# Patient Record
Sex: Female | Born: 1955 | Race: White | Hispanic: No | Marital: Married | Smoking: Former smoker
Health system: Southern US, Community
[De-identification: ages and names within clinical notes are randomized; demographics above are authoritative.]

## PROBLEM LIST (undated history)

## (undated) DIAGNOSIS — L439 Lichen planus, unspecified: Secondary | ICD-10-CM

## (undated) DIAGNOSIS — S0191XA Laceration without foreign body of unspecified part of head, initial encounter: Secondary | ICD-10-CM

## (undated) DIAGNOSIS — N6019 Diffuse cystic mastopathy of unspecified breast: Secondary | ICD-10-CM

## (undated) DIAGNOSIS — E039 Hypothyroidism, unspecified: Secondary | ICD-10-CM

## (undated) DIAGNOSIS — T4145XA Adverse effect of unspecified anesthetic, initial encounter: Secondary | ICD-10-CM

## (undated) DIAGNOSIS — E0500 Thyrotoxicosis with diffuse goiter without thyrotoxic crisis or storm: Secondary | ICD-10-CM

## (undated) DIAGNOSIS — T8859XA Other complications of anesthesia, initial encounter: Secondary | ICD-10-CM

## (undated) HISTORY — DX: Thyrotoxicosis with diffuse goiter without thyrotoxic crisis or storm: E05.00

## (undated) HISTORY — DX: Lichen planus, unspecified: L43.9

---

## 2000-09-26 HISTORY — PX: BREAST BIOPSY: SHX20

## 2001-05-16 ENCOUNTER — Other Ambulatory Visit: Admission: RE | Admit: 2001-05-16 | Discharge: 2001-05-16 | Payer: Self-pay | Admitting: Radiology

## 2002-09-26 HISTORY — PX: ABDOMINAL HYSTERECTOMY: SHX81

## 2007-02-06 ENCOUNTER — Ambulatory Visit: Payer: Self-pay | Admitting: Internal Medicine

## 2007-07-19 ENCOUNTER — Encounter: Payer: Self-pay | Admitting: Internal Medicine

## 2007-07-28 ENCOUNTER — Encounter: Payer: Self-pay | Admitting: Internal Medicine

## 2008-06-17 ENCOUNTER — Ambulatory Visit: Payer: Self-pay | Admitting: Internal Medicine

## 2010-03-24 ENCOUNTER — Ambulatory Visit: Payer: Self-pay | Admitting: Internal Medicine

## 2010-06-07 ENCOUNTER — Ambulatory Visit: Payer: Self-pay | Admitting: Internal Medicine

## 2012-09-17 ENCOUNTER — Other Ambulatory Visit (HOSPITAL_COMMUNITY)
Admission: RE | Admit: 2012-09-17 | Discharge: 2012-09-17 | Disposition: A | Discharge status: Home / Self Care | Payer: 59 | Source: Ambulatory Visit | Attending: Internal Medicine | Admitting: Internal Medicine

## 2012-09-17 ENCOUNTER — Encounter: Payer: Self-pay | Admitting: Internal Medicine

## 2012-09-17 ENCOUNTER — Ambulatory Visit (INDEPENDENT_AMBULATORY_CARE_PROVIDER_SITE_OTHER): Payer: 59 | Admitting: Internal Medicine

## 2012-09-17 VITALS — BP 126/70 | HR 68 | Temp 98.2°F | Ht 69.0 in | Wt 166.8 lb

## 2012-09-17 DIAGNOSIS — R5383 Other fatigue: Secondary | ICD-10-CM

## 2012-09-17 DIAGNOSIS — L439 Lichen planus, unspecified: Secondary | ICD-10-CM

## 2012-09-17 DIAGNOSIS — E0500 Thyrotoxicosis with diffuse goiter without thyrotoxic crisis or storm: Secondary | ICD-10-CM

## 2012-09-17 DIAGNOSIS — Z01419 Encounter for gynecological examination (general) (routine) without abnormal findings: Secondary | ICD-10-CM | POA: Insufficient documentation

## 2012-09-17 DIAGNOSIS — Z0000 Encounter for general adult medical examination without abnormal findings: Secondary | ICD-10-CM

## 2012-09-17 DIAGNOSIS — N951 Menopausal and female climacteric states: Secondary | ICD-10-CM

## 2012-09-17 DIAGNOSIS — R413 Other amnesia: Secondary | ICD-10-CM

## 2012-09-17 DIAGNOSIS — Z139 Encounter for screening, unspecified: Secondary | ICD-10-CM

## 2012-09-17 DIAGNOSIS — R5381 Other malaise: Secondary | ICD-10-CM

## 2012-09-17 DIAGNOSIS — Z1151 Encounter for screening for human papillomavirus (HPV): Secondary | ICD-10-CM | POA: Insufficient documentation

## 2012-09-18 ENCOUNTER — Other Ambulatory Visit (INDEPENDENT_AMBULATORY_CARE_PROVIDER_SITE_OTHER): Payer: 59

## 2012-09-18 DIAGNOSIS — N951 Menopausal and female climacteric states: Secondary | ICD-10-CM

## 2012-09-18 DIAGNOSIS — R413 Other amnesia: Secondary | ICD-10-CM

## 2012-09-18 DIAGNOSIS — R5381 Other malaise: Secondary | ICD-10-CM

## 2012-09-18 DIAGNOSIS — E0500 Thyrotoxicosis with diffuse goiter without thyrotoxic crisis or storm: Secondary | ICD-10-CM

## 2012-09-18 DIAGNOSIS — R5383 Other fatigue: Secondary | ICD-10-CM

## 2012-09-18 DIAGNOSIS — Z139 Encounter for screening, unspecified: Secondary | ICD-10-CM

## 2012-09-18 LAB — CBC WITH DIFFERENTIAL/PLATELET
Eosinophils Relative: 2.9 % (ref 0.0–5.0)
HCT: 39.2 % (ref 36.0–46.0)
Lymphs Abs: 1.7 10*3/uL (ref 0.7–4.0)
MCHC: 33.2 g/dL (ref 30.0–36.0)
MCV: 89 fl (ref 78.0–100.0)
Monocytes Absolute: 0.4 10*3/uL (ref 0.1–1.0)
Platelets: 214 10*3/uL (ref 150.0–400.0)
RDW: 13.1 % (ref 11.5–14.6)
WBC: 4.9 10*3/uL (ref 4.5–10.5)

## 2012-09-18 LAB — COMPREHENSIVE METABOLIC PANEL
Alkaline Phosphatase: 46 U/L (ref 39–117)
BUN: 12 mg/dL (ref 6–23)
Glucose, Bld: 108 mg/dL — ABNORMAL HIGH (ref 70–99)
Sodium: 136 mEq/L (ref 135–145)
Total Bilirubin: 0.9 mg/dL (ref 0.3–1.2)
Total Protein: 7.2 g/dL (ref 6.0–8.3)

## 2012-09-18 LAB — LIPID PANEL
Cholesterol: 151 mg/dL (ref 0–200)
LDL Cholesterol: 52 mg/dL (ref 0–99)
Triglycerides: 33 mg/dL (ref 0.0–149.0)
VLDL: 6.6 mg/dL (ref 0.0–40.0)

## 2012-09-20 ENCOUNTER — Other Ambulatory Visit: Payer: Self-pay | Admitting: Internal Medicine

## 2012-09-20 DIAGNOSIS — R739 Hyperglycemia, unspecified: Secondary | ICD-10-CM

## 2012-09-20 NOTE — Progress Notes (Signed)
Orders placed for follow up labs 

## 2012-09-22 ENCOUNTER — Encounter: Payer: Self-pay | Admitting: Internal Medicine

## 2012-09-22 NOTE — Assessment & Plan Note (Signed)
On no medication.  Check thyroid function.

## 2012-09-22 NOTE — Progress Notes (Signed)
Subjective:    [name], female    DOB: [birth year], 56 y.o.   MRN: [id number]  HPI 56 year old female with past history of Graves disease and Lichen Planus who comes in today to follow up on these issues as well as for a complete physical exam.  She is noticing some increased acid reflux previously.  Not noticing now.  Discussed the importance of not eating late and avoiding foods that aggravate.  She is also wanting to be checked if she is post menopausal.  Wants FSH checked.  Some minimal hot flashes.  Some concern with not being able to remember as well as she could previously.   No affecting her work.  Has been under a lot of stress lately.  Feels since some of this is leveling off, the memory is improving.  Some pain in her left groin.  Present for 3-4 weeks.  (actually pain localized to left inner thigh).  Denies any known trauma or injury.  She feels better.  Stress is better.  New job.  Some fatigue.    Past Medical History  Diagnosis Date  . Graves disease   . Lichen planus     Review of Systems Patient denies any headache, lightheadedness or dizziness.  No significant sinus or allergy symptoms.  No chest pain, tightness or palpitations.  No increased shortness of breath, cough or congestion.  No nausea or vomiting. Acid reflux resolved now.  No abdominal pain or cramping.  No bowel change, such as diarrhea, constipation, BRBPR or melana.  No urine change.  Some minimal hot flashes as outlined.        Objective:   Physical Exam Filed Vitals:   09/17/12 1052  BP: 126/70  Pulse: 68  Temp: 98.2 F (77.74 C)   56 year old female in no acute distress.   HEENT:  Nares- clear.  Oropharynx - without lesions. NECK:  Supple.  Nontender.  No audible bruit.  HEART:  Appears to be regular. LUNGS:  No crackles or wheezing audible.  Respirations even and unlabored.  RADIAL PULSE:  Equal bilaterally.    BREASTS:  No nipple discharge or nipple retraction present.  Could not  appreciate any distinct nodules or axillary adenopathy.  ABDOMEN:  Soft, nontender.  Bowel sounds present and normal.  No audible abdominal bruit.  GU:  Normal external genitalia.  Vaginal vault without lesions.  S/P hysterectomy.  Pap performed. Could not appreciate any adnexal masses or tenderness.   RECTAL:  Heme negative.   EXTREMITIES:  No increased edema present.  DP pulses palpable and equal bilaterally.           Assessment & Plan:  MEMORY CHANGE.  Discussed at length with her today.  She did well on the mini mental status exam (not missing any questions).  She feels memory is improving.  Discussed possible etiologies, including poor concentration, etc.  Will follow.  Can check routine labs including B12.    MSK.  Left inner thigh discomfort.  No pain to palpation on exam.  No mass.  Discussed stretches, etc.  Follow.  Notify me if persistent problems.    FATIGUE.  Check cbc, met c and tsh.   PROBABLE MENOPAUSAL SYNDROME.  Pt request FSH check.    CARDIOVASCULAR.  Asymptomatic.  ECHO 02/21/07 revealed normal LV systolic function with EF 50%.  Moderate mitral and tricuspid regurgitation with mild aortic insufficiency.   Follow.    PREVIOUS ACID REFLUX.  Not an issue  now.  Discussed not eating late, etc. Follow.    HEALTH MAINTENANCE.  Physical today.  Schedule mammogram.  Needs screening colonoscopy.  Refer to GI.

## 2012-09-22 NOTE — Assessment & Plan Note (Signed)
Stable.  Follow.   

## 2012-09-27 ENCOUNTER — Ambulatory Visit: Payer: Self-pay | Admitting: Internal Medicine

## 2012-10-08 ENCOUNTER — Other Ambulatory Visit (INDEPENDENT_AMBULATORY_CARE_PROVIDER_SITE_OTHER): Payer: 59

## 2012-10-08 DIAGNOSIS — R739 Hyperglycemia, unspecified: Secondary | ICD-10-CM

## 2012-10-08 DIAGNOSIS — R7309 Other abnormal glucose: Secondary | ICD-10-CM

## 2012-10-10 ENCOUNTER — Encounter: Payer: Self-pay | Admitting: *Deleted

## 2012-10-10 ENCOUNTER — Encounter: Payer: Self-pay | Admitting: Internal Medicine

## 2012-10-15 ENCOUNTER — Telehealth: Payer: Self-pay | Admitting: Internal Medicine

## 2012-10-15 NOTE — Telephone Encounter (Signed)
Voice mail taking off triage line, from Annice Pih that stated she faxed recommendations on Thursday and hasn't heard back from office. Please advise, she didn't leave a number for [place] to call her back at.

## 2012-10-17 NOTE — Telephone Encounter (Signed)
Faxed form.

## 2012-10-17 NOTE — Telephone Encounter (Signed)
Order signed and placed on your desk. Please fax.  °

## 2012-11-10 ENCOUNTER — Other Ambulatory Visit: Payer: Self-pay

## 2013-02-14 ENCOUNTER — Telehealth: Payer: Self-pay | Admitting: *Deleted

## 2013-02-14 ENCOUNTER — Other Ambulatory Visit: Payer: Self-pay | Admitting: *Deleted

## 2013-02-14 MED ORDER — TRIAMCINOLONE 0.1 % CREAM:EUCERIN CREAM 1:1: 1.0000 "application " | TOPICAL_CREAM | Freq: Two times a day (BID) | CUTANEOUS | Status: DC

## 2013-02-14 NOTE — Telephone Encounter (Signed)
Ok to refill x 1  

## 2013-02-14 NOTE — Telephone Encounter (Signed)
Okay to refill? 

## 2013-02-14 NOTE — Telephone Encounter (Signed)
Refilled x1 

## 2013-02-14 NOTE — Telephone Encounter (Signed)
Refill Request  Triamcinolone 0.1% topical cream GM  #80  Apply to rash twice a day     PHARMACY NOTE:  Patient as poison ivy again

## 2013-03-08 ENCOUNTER — Other Ambulatory Visit: Payer: Self-pay | Admitting: *Deleted

## 2013-03-08 ENCOUNTER — Telehealth: Payer: Self-pay | Admitting: *Deleted

## 2013-03-08 NOTE — Telephone Encounter (Signed)
done

## 2013-03-08 NOTE — Telephone Encounter (Signed)
Okay to refill? 

## 2013-03-08 NOTE — Telephone Encounter (Signed)
Progesterone 100mg  #45 with 2 RF called in (Could not enter RX in meds & orders)

## 2013-03-08 NOTE — Telephone Encounter (Signed)
Opened in eror

## 2013-03-08 NOTE — Telephone Encounter (Signed)
Refill Request  Progesterone 100 mg SR cap  Take one to two capsules by mouth at bedtime as directed

## 2013-03-08 NOTE — Telephone Encounter (Signed)
Ok to refill x 2.  Will probably need to be called in.  I could not get this to come up in system.

## 2013-03-18 ENCOUNTER — Encounter: Payer: Self-pay | Admitting: Internal Medicine

## 2013-03-18 ENCOUNTER — Ambulatory Visit (INDEPENDENT_AMBULATORY_CARE_PROVIDER_SITE_OTHER): Payer: 59 | Admitting: Internal Medicine

## 2013-03-18 VITALS — BP 120/70 | HR 54 | Temp 98.3°F | Ht 69.0 in | Wt 165.2 lb

## 2013-03-18 DIAGNOSIS — L439 Lichen planus, unspecified: Secondary | ICD-10-CM

## 2013-03-18 DIAGNOSIS — E0500 Thyrotoxicosis with diffuse goiter without thyrotoxic crisis or storm: Secondary | ICD-10-CM

## 2013-03-18 NOTE — Progress Notes (Signed)
  Subjective:    [name], female    DOB: 18-Nov-1955, 57 y.o.   MRN: [id number]  HPI 57 year old female with past history of Graves disease and Lichen Planus who comes in today for a scheduled follow up. Was under a lot of stress previously.  Has changed jobs.  Stress better now.  She feels better.  The previous groin pain has resolved.  No acid reflux.  Stays active.  No chest pain or tightness.  No sob.  She does report some right hip discomfort at times.  Does not limit her in doing her normal activity.  Plays racketball 2x/week.  Planning to start Yoga.  Overall she feels she is doing better.      Past Medical History  Diagnosis Date  . Graves disease   . Lichen planus     Outpatient Encounter Prescriptions as of 03/18/2013  Medication Sig Dispense Refill  . progesterone (PROMETRIUM) 100 MG capsule Take 100 mg by mouth daily. Take 1-2 at bedtime      . Triamcinolone Acetonide (TRIAMCINOLONE 0.1 % CREAM : EUCERIN) CREA Apply 1 application topically 2 (two) times daily. Apply to poison ivy  1 each  0   No facility-administered encounter medications on file as of 03/18/2013.    Review of Systems Patient denies any headache, lightheadedness or dizziness.  No significant sinus or allergy symptoms.  No chest pain, tightness or palpitations.  No increased shortness of breath, cough or congestion.  No nausea or vomiting.  No acid reflux.  No abdominal pain or cramping.  No bowel change, such as diarrhea, constipation, BRBPR or melana.  No urine change.  Overall she feels better and feels she id doing well.      Objective:   Physical Exam  Filed Vitals:   03/18/13 1121  BP: 120/70  Pulse: 54  Temp: 98.3 F (36.8 C)   Pulse recheck:  18-77  57 year old female in no acute distress.   HEENT:  Nares- clear.  Oropharynx - without lesions. NECK:  Supple.  Nontender.  No audible bruit.  HEART:  Appears to be regular. LUNGS:  No crackles or wheezing audible.  Respirations  even and unlabored.  RADIAL PULSE:  Equal bilaterally. ABDOMEN:  Soft, nontender.  Bowel sounds present and normal.  No audible abdominal bruit.    EXTREMITIES:  No increased edema present.  DP pulses palpable and equal bilaterally.           Assessment & Plan:  MEMORY CHANGE.  Not an issue for her now.  Follow.     MSK.  The previus left inner thigh pain resolved.  Some intermittent right hip pain.  Desires no further w/up now.  Plans to start Yoga.  Follow.    CARDIOVASCULAR.  Asymptomatic.  ECHO 02/21/07 revealed normal LV systolic function with EF 50%.  Moderate mitral and tricuspid regurgitation with mild aortic insufficiency.   Follow.    PREVIOUS ACID REFLUX.  Not an issue now.  Discussed not eating late, etc. Follow.    HEALTH MAINTENANCE.  Physical 09/17/12.  Mammogram 09/27/12 - Birads II.  Was referred to GI last visit.  Obtain records.

## 2013-03-18 NOTE — Assessment & Plan Note (Signed)
Stable.  Follow.   

## 2013-03-18 NOTE — Assessment & Plan Note (Signed)
On no medication.  Follow thyroid function.  Last check wnl.

## 2013-07-01 ENCOUNTER — Ambulatory Visit (INDEPENDENT_AMBULATORY_CARE_PROVIDER_SITE_OTHER): Payer: 59 | Admitting: Internal Medicine

## 2013-07-01 ENCOUNTER — Encounter: Payer: Self-pay | Admitting: Internal Medicine

## 2013-07-01 VITALS — BP 122/78 | HR 58 | Temp 98.3°F | Ht 69.0 in | Wt 162.8 lb

## 2013-07-01 DIAGNOSIS — Z9109 Other allergy status, other than to drugs and biological substances: Secondary | ICD-10-CM

## 2013-07-01 DIAGNOSIS — L439 Lichen planus, unspecified: Secondary | ICD-10-CM

## 2013-07-01 DIAGNOSIS — K1370 Unspecified lesions of oral mucosa: Secondary | ICD-10-CM

## 2013-07-01 LAB — CBC WITH DIFFERENTIAL/PLATELET
Basophils Relative: 0.6 % (ref 0.0–3.0)
Eosinophils Absolute: 0.1 10*3/uL (ref 0.0–0.7)
Lymphocytes Relative: 30.4 % (ref 12.0–46.0)
MCHC: 34 g/dL (ref 30.0–36.0)
Monocytes Relative: 6.2 % (ref 3.0–12.0)
Neutrophils Relative %: 61.7 % (ref 43.0–77.0)
RBC: 4.18 Mil/uL (ref 3.87–5.11)
WBC: 6.1 10*3/uL (ref 4.5–10.5)

## 2013-07-01 NOTE — Patient Instructions (Signed)
Saline nasal spray - flush nose at least 2-3 times per day.  claritin or allegra as directed.

## 2013-07-03 ENCOUNTER — Encounter: Payer: Self-pay | Admitting: Internal Medicine

## 2013-07-03 DIAGNOSIS — Z9109 Other allergy status, other than to drugs and biological substances: Secondary | ICD-10-CM | POA: Insufficient documentation

## 2013-07-03 NOTE — Assessment & Plan Note (Signed)
Saline flushes as directed.  Antihistamine if needed.  Follow.

## 2013-07-03 NOTE — Progress Notes (Signed)
  Subjective:    [name], female    DOB: November 27, 1955, 57 y.o.   MRN: [id number]  Mouth Lesions  Associated symptoms include mouth sores.  57 year old female with past history of Graves disease and Lichen Planus who comes in today as a work in with concerns regarding some mouth lesions.  States she first noticed (last week) a lesion on her right lower lip.  This week, noticed a lesion on her left lower lip.  She also noticed some places inside her mouth (right and left).  The lesion on her lip is raised.  The inside lesions looks like appear to possibly be more c/w trauma.  She has had some minimal congestion and sore throat this am.  Ears feel stopped up at times.  Glands feel a little swollen.  Some drainage.  No fever.  No rash.  No significant headache.  No nausea or vomiting.  Eating and drinking well.  Previous bite on her ankle.  No joint stiffness.      Past Medical History  Diagnosis Date  . Graves disease   . Lichen planus     Outpatient Encounter Prescriptions as of 07/01/2013  Medication Sig Dispense Refill  . progesterone (PROMETRIUM) 100 MG capsule Take 100 mg by mouth daily. Take 1-2 at bedtime      . Triamcinolone Acetonide (TRIAMCINOLONE 0.1 % CREAM : EUCERIN) CREA Apply 1 application topically 2 (two) times daily. Apply to poison ivy  1 each  0   No facility-administered encounter medications on file as of 07/01/2013.    Review of Systems  HENT: Positive for mouth sores.   Patient denies any headache, lightheadedness or dizziness.  Minimal congestion and drainage as outlined.  No chest pain, tightness or palpitations.  No increased shortness of breath, cough or congestion.  No nausea or vomiting.  No acid reflux.  No abdominal pain or cramping.  No bowel change, such as diarrhea, constipation, BRBPR or melana.  No urine change.  Overall she feels better and feels good.       Objective:   Physical Exam  Filed Vitals:   07/01/13 1036  BP: 122/78  Pulse:  58  Temp: 98.3 F (23.31 C)   57 year old female in no acute distress.   HEENT:  Nares- clear.  TMs without erythema.  Small raised lip lesion.  Few point lesions oropharynx - appear to be more c/w trauma.  No lesions or erythema - posterior oropharynx.   NECK:  Supple.  Nontender.  No audible bruit.  No adenopathy appreciated.   HEART:  Appears to be regular. LUNGS:  No crackles or wheezing audible.  Respirations even and unlabored.    EXTREMITIES:  No increased edema present.  DP pulses palpable and equal bilaterally.      SKIN:  No rash.       Assessment & Plan:  MOUTH LESIONS.  Lip lesion as outlined.  May be a viral etiology.  denavir cream as directed.  Follow.  The lesions in the oropharynx appear to be more c/w trauma.  Discussed multiple etiologies.  Non tender.  Will check cbc.  Monitor.  Follow.  If persistent, will have ENT evaluate.  She will call with an update over the next week.     HEALTH MAINTENANCE.  Physical 09/17/12.  Mammogram 09/27/12 - Birads II.  Was referred to GI last visit.  Obtain records.

## 2013-07-03 NOTE — Assessment & Plan Note (Signed)
Does not appear to be c/w lichen planus.  Stable. Follow.

## 2013-07-04 NOTE — Telephone Encounter (Signed)
Mailed unread message to pt  

## 2013-07-09 ENCOUNTER — Encounter: Payer: Self-pay | Admitting: Internal Medicine

## 2013-09-24 ENCOUNTER — Encounter: Payer: Self-pay | Admitting: Internal Medicine

## 2013-09-24 ENCOUNTER — Ambulatory Visit (INDEPENDENT_AMBULATORY_CARE_PROVIDER_SITE_OTHER): Payer: No Typology Code available for payment source | Admitting: Internal Medicine

## 2013-09-24 VITALS — BP 120/70 | HR 65 | Temp 98.2°F | Ht 69.5 in | Wt 166.8 lb

## 2013-09-24 DIAGNOSIS — Z1239 Encounter for other screening for malignant neoplasm of breast: Secondary | ICD-10-CM

## 2013-09-24 DIAGNOSIS — E039 Hypothyroidism, unspecified: Secondary | ICD-10-CM

## 2013-09-24 DIAGNOSIS — E0500 Thyrotoxicosis with diffuse goiter without thyrotoxic crisis or storm: Secondary | ICD-10-CM

## 2013-09-24 DIAGNOSIS — L439 Lichen planus, unspecified: Secondary | ICD-10-CM

## 2013-09-24 NOTE — Progress Notes (Signed)
Pre-visit discussion using our clinic review tool. No additional management support is needed unless otherwise documented below in the visit note.  

## 2013-09-24 NOTE — Progress Notes (Signed)
  Subjective:    [name], female    DOB: Jan 30, 1956, 57 y.o.   MRN: [id number]  HPI 57 year old female with past history of Graves disease and Lichen Planus who comes in today to follow up on these issues as well as for a complete physical exam.   Was under a lot of stress previously.  Has changed jobs.  Stress better now.  She feels better.  No acid reflux.  Stays active.  No chest pain or tightness.  No sob.   Plays racketball 2x/week.  Planning to start Yoga.  Overall she feels she is doing better.  Taking the progesterone.  Some decreased libido.  Wants to try the testosterone again.  Tolerated previously.  The previous lesion on her lip resolved.  She saw her dentist and was told the lesions on her mouth were a form of lichen planus.  Some congestion, but overall feels she is doing well.      Past Medical History  Diagnosis Date  . Graves disease   . Lichen planus     Outpatient Encounter Prescriptions as of 09/24/2013  Medication Sig  . Misc Natural Products (NF FORMULAS TESTOSTERONE PO) Take by mouth. Take 1mg  twice a week  . progesterone (PROMETRIUM) 100 MG capsule Take 100 mg by mouth daily. Take 1-2 at bedtime  . Triamcinolone Acetonide (TRIAMCINOLONE 0.1 % CREAM : EUCERIN) CREA Apply 1 application topically 2 (two) times daily. Apply to poison ivy    Review of Systems Patient denies any headache, lightheadedness or dizziness.  No significant sinus or allergy symptoms.  No chest pain, tightness or palpitations.  No increased shortness of breath.  Some cough and congestion.   No nausea or vomiting.  No acid reflux.  No abdominal pain or cramping.  No bowel change, such as diarrhea, constipation, BRBPR or melana.  No urine change.  Overall she feels better and feels she is doing well.      Objective:   Physical Exam  Filed Vitals:   09/24/13 1049  BP: 120/70  Pulse: 65  Temp: 98.2 F (28.1 C)   57 year old female in no acute distress.   HEENT:  Nares-  clear.  Oropharynx - without lesions. NECK:  Supple.  Nontender.  No audible bruit.  HEART:  Appears to be regular. LUNGS:  No crackles or wheezing audible.  Respirations even and unlabored.  RADIAL PULSE:  Equal bilaterally.    BREASTS:  No nipple discharge or nipple retraction present.  Could not appreciate any distinct nodules or axillary adenopathy.  ABDOMEN:  Soft, nontender.  Bowel sounds present and normal.  No audible abdominal bruit.  GU:  Not performed.     EXTREMITIES:  No increased edema present.  DP pulses palpable and equal bilaterally.          Assessment & Plan:  MEMORY CHANGE.  Not a significant issue for her now.  She feels is better.  Follow.     CARDIOVASCULAR.  Asymptomatic.  ECHO 02/21/07 revealed normal LV systolic function with EF 50%.  Moderate mitral and tricuspid regurgitation with mild aortic insufficiency.   Follow.    PREVIOUS ACID REFLUX.  Not an issue now.  Discussed not eating late, etc. Follow.    HEALTH MAINTENANCE.  Physical today.  Mammogram 09/27/12 - Birads II.  Schedule a f/u mammogram.   Needs colonoscopy.

## 2013-09-26 ENCOUNTER — Encounter: Payer: Self-pay | Admitting: Internal Medicine

## 2013-09-26 NOTE — Assessment & Plan Note (Signed)
Stable

## 2013-09-26 NOTE — Assessment & Plan Note (Addendum)
On no medication.  Follow thyroid function.  Last check wnl.  Recheck today.

## 2013-10-07 ENCOUNTER — Ambulatory Visit: Payer: Self-pay | Admitting: Internal Medicine

## 2013-10-07 LAB — HM MAMMOGRAPHY: HM Mammogram: NEGATIVE

## 2013-10-08 ENCOUNTER — Encounter: Payer: Self-pay | Admitting: Internal Medicine

## 2013-10-09 ENCOUNTER — Other Ambulatory Visit: Payer: Self-pay | Admitting: Internal Medicine

## 2013-10-10 ENCOUNTER — Other Ambulatory Visit: Payer: Self-pay | Admitting: *Deleted

## 2013-10-10 MED ORDER — PROGESTERONE MICRONIZED 100 MG PO CAPS: 100.0000 mg | ORAL_CAPSULE | Freq: Every day | ORAL | Status: DC

## 2013-10-10 MED ORDER — NF FORMULAS TESTOSTERONE PO CAPS: ORAL_CAPSULE | ORAL | Status: DC

## 2014-06-19 ENCOUNTER — Other Ambulatory Visit: Payer: Self-pay | Admitting: *Deleted

## 2014-06-19 MED ORDER — PROGESTERONE MICRONIZED 100 MG PO CAPS: ORAL_CAPSULE | ORAL | Status: DC

## 2014-06-19 NOTE — Telephone Encounter (Signed)
Refilled prometrium #45 with no refills.

## 2014-06-19 NOTE — Telephone Encounter (Signed)
Ok refill? 

## 2014-07-25 ENCOUNTER — Telehealth: Payer: Self-pay | Admitting: *Deleted

## 2014-07-25 ENCOUNTER — Telehealth: Payer: Self-pay | Admitting: Internal Medicine

## 2014-07-25 NOTE — Telephone Encounter (Signed)
Pt returned called regarding Rx request for the E3 1MG  + TESTOST 1MG  VAG TAB. (directions: insert one tablet vaginally once a day for 7-10 days, then 2-3 times a week as directed). Pt states that she would like to restart this medication after noticing some dryness. She just used up the previous Rx that she didn't even start until now. Pt reports that it seems to be helping. Please advise on refill.

## 2014-07-25 NOTE — Telephone Encounter (Signed)
Notify her that I am ok to refill with the instructions to use 2x/week.  It appears from the note that the pharmacy sent a request.  If we can call or send in that request, I wasn;t sure which specific testosterone this was.  Give her #30 with no refills.  Let her know we can talk about this at her next appt.  Would like to get her off of this eventually.

## 2014-07-25 NOTE — Telephone Encounter (Signed)
LMTCB-needs to know if she still uses the Testost? If so how often does she uses it? A refill request was received from [facility]. Sent mychart message to patient also.

## 2014-07-25 NOTE — Telephone Encounter (Signed)
Pt notified Rx faxed.

## 2014-07-27 NOTE — Telephone Encounter (Signed)
Opened in error

## 2014-09-29 ENCOUNTER — Telehealth: Payer: Self-pay | Admitting: *Deleted

## 2014-09-29 ENCOUNTER — Ambulatory Visit (INDEPENDENT_AMBULATORY_CARE_PROVIDER_SITE_OTHER): Payer: No Typology Code available for payment source | Admitting: Internal Medicine

## 2014-09-29 ENCOUNTER — Encounter: Payer: Self-pay | Admitting: Internal Medicine

## 2014-09-29 VITALS — BP 120/64 | HR 66 | Temp 98.2°F | Ht 68.75 in | Wt 165.5 lb

## 2014-09-29 DIAGNOSIS — E0500 Thyrotoxicosis with diffuse goiter without thyrotoxic crisis or storm: Secondary | ICD-10-CM

## 2014-09-29 DIAGNOSIS — Z13220 Encounter for screening for lipoid disorders: Secondary | ICD-10-CM

## 2014-09-29 DIAGNOSIS — Z0000 Encounter for general adult medical examination without abnormal findings: Secondary | ICD-10-CM

## 2014-09-29 DIAGNOSIS — L439 Lichen planus, unspecified: Secondary | ICD-10-CM

## 2014-09-29 DIAGNOSIS — R739 Hyperglycemia, unspecified: Secondary | ICD-10-CM

## 2014-09-29 DIAGNOSIS — Z1239 Encounter for other screening for malignant neoplasm of breast: Secondary | ICD-10-CM

## 2014-09-29 MED ORDER — NF FORMULAS TESTOSTERONE PO CAPS: ORAL_CAPSULE | ORAL | Status: DC

## 2014-09-29 MED ORDER — PROGESTERONE MICRONIZED 100 MG PO CAPS
ORAL_CAPSULE | ORAL | Status: DC
Start: 2014-09-29 — End: 2014-09-30

## 2014-09-29 NOTE — Progress Notes (Signed)
Subjective:    [name], female    DOB: 07/06/1956, 59 y.o.   MRN: [id number]  HPI 59 year old female with past history of Graves disease and Lichen Planus who comes in today to follow up on these issues as well as for a complete physical exam.  Stress better.  Enjoying her job.  She feels better.  No acid reflux.  Stays active.  No chest pain or tightness.  No sob.   Plays racketball.    Overall she feels she is doing better.   Wants to try the testosterone again.  Tolerated previously.  Also wants to return to her low dose estrogen.  She uses sparingly.  Lichen planus - stable.  Overall feels she is doing well.       Past Medical History  Diagnosis Date  . Graves disease   . Lichen planus     Outpatient Encounter Prescriptions as of 09/29/2014  Medication Sig  . Misc Natural Products (NF FORMULAS TESTOSTERONE) CAPS Use as directed  . progesterone (PROMETRIUM) 100 MG capsule Take 1-2 at bedtime  . Triamcinolone Acetonide (TRIAMCINOLONE 0.1 % CREAM : EUCERIN) CREA Apply 1 application topically 2 (two) times daily. Apply to poison ivy    Review of Systems Patient denies any headache, lightheadedness or dizziness.  No significant sinus or allergy symptoms.  No chest pain, tightness or palpitations.  No increased shortness of breath.  No cough and congestion.   No nausea or vomiting.  No acid reflux.  No abdominal pain or cramping.  No bowel change, such as diarrhea, constipation, BRBPR or melana.  No urine change.  Overall she feels better and feels she is doing well.  Enjoying her job.  Stress better.  Desires to start back on her testosterone and estrogen.       Objective:   Physical Exam  Filed Vitals:   09/29/14 1338  BP: 120/64  Pulse: 66  Temp: 98.2 F (36.8 C)   Blood pressure recheck:  27/79  59 year old female in no acute distress.   HEENT:  Nares- clear.  Oropharynx - without lesions. NECK:  Supple.  Nontender.  No audible bruit.  HEART:  Appears to  be regular. LUNGS:  No crackles or wheezing audible.  Respirations even and unlabored.  RADIAL PULSE:  Equal bilaterally.    BREASTS:  No nipple discharge or nipple retraction present.  Could not appreciate any distinct nodules or axillary adenopathy.  ABDOMEN:  Soft, nontender.  Bowel sounds present and normal.  No audible abdominal bruit.  GU:  Not performed.     EXTREMITIES:  No increased edema present.  DP pulses palpable and equal bilaterally.          Assessment & Plan:  1. Breast cancer screening - MM DIGITAL SCREENING BILATERAL; Future  2. Graves disease Stable.   - TSH; Future  3. Lichen planus Stable.  Followed by her dentist.   - CBC with Differential; Future  4. Hyperglycemia Last a1c wnl.  Recheck to confirm stable.   - Comprehensive metabolic panel; Future - Hemoglobin A1c; Future  5. Screening cholesterol level - Lipid panel; Future  6. CARDIOVASCULAR.  Asymptomatic.  ECHO 02/21/07 revealed normal LV systolic function with EF 50%.  Moderate mitral and tricuspid regurgitation with mild aortic insufficiency.  Stays active with no cardiac symptoms with increased activity or exertion.      7. PREVIOUS ACID REFLUX.  Not an issue now.    HEALTH MAINTENANCE.  Physical today.  Mammogram 09/27/13 - Birads I.  Schedule a f/u mammogram.   Needs colonoscopy.  Declines.  IFOB to be given at her lab appt.  PAP 09/17/12 - negative with negative HPV.

## 2014-09-29 NOTE — Progress Notes (Signed)
Pre visit review using our clinic review tool, if applicable. No additional management support is needed unless otherwise documented below in the visit note. 

## 2014-09-29 NOTE — Telephone Encounter (Signed)
Need to refill the dosing that she has been on.  Please clarify and change on our med list and then ok to refill as instructed today.  (need same dosing and directions she has been on).  Thanks.

## 2014-09-29 NOTE — Telephone Encounter (Signed)
Ok.  Hold until we receive.   

## 2014-09-29 NOTE — Telephone Encounter (Signed)
Pharmacist from [facility] called in reference to Progesterone and Testosterone refills.  Pharmacist is needing more information as the Rxs from today are different from previous Rxs.  Please call [phone]

## 2014-09-29 NOTE — Telephone Encounter (Signed)
Spoke with Cordelia Pen at the pharmacy.  She said she would fax over the Rx.

## 2014-09-30 ENCOUNTER — Other Ambulatory Visit: Payer: Self-pay | Admitting: Internal Medicine

## 2014-09-30 NOTE — Telephone Encounter (Signed)
rx complete.

## 2014-10-04 ENCOUNTER — Encounter: Payer: Self-pay | Admitting: Internal Medicine

## 2014-10-08 ENCOUNTER — Encounter: Payer: Self-pay | Admitting: *Deleted

## 2014-10-08 ENCOUNTER — Ambulatory Visit: Payer: Self-pay | Admitting: Internal Medicine

## 2014-10-08 LAB — HM MAMMOGRAPHY: HM Mammogram: NEGATIVE

## 2014-10-13 ENCOUNTER — Other Ambulatory Visit (INDEPENDENT_AMBULATORY_CARE_PROVIDER_SITE_OTHER): Payer: No Typology Code available for payment source

## 2014-10-13 DIAGNOSIS — R739 Hyperglycemia, unspecified: Secondary | ICD-10-CM

## 2014-10-13 DIAGNOSIS — E0500 Thyrotoxicosis with diffuse goiter without thyrotoxic crisis or storm: Secondary | ICD-10-CM

## 2014-10-13 DIAGNOSIS — Z13220 Encounter for screening for lipoid disorders: Secondary | ICD-10-CM

## 2014-10-13 DIAGNOSIS — L439 Lichen planus, unspecified: Secondary | ICD-10-CM

## 2014-10-13 LAB — CBC WITH DIFFERENTIAL/PLATELET
BASOS PCT: 1 % (ref 0.0–3.0)
Basophils Absolute: 0.1 10*3/uL (ref 0.0–0.1)
EOS PCT: 3.4 % (ref 0.0–5.0)
Eosinophils Absolute: 0.2 10*3/uL (ref 0.0–0.7)
HCT: 38.3 % (ref 36.0–46.0)
Hemoglobin: 12.6 g/dL (ref 12.0–15.0)
Lymphocytes Relative: 28.9 % (ref 12.0–46.0)
Lymphs Abs: 1.6 10*3/uL (ref 0.7–4.0)
MCHC: 32.8 g/dL (ref 30.0–36.0)
MCV: 90 fl (ref 78.0–100.0)
Monocytes Absolute: 0.4 10*3/uL (ref 0.1–1.0)
Monocytes Relative: 7.2 % (ref 3.0–12.0)
Neutro Abs: 3.4 10*3/uL (ref 1.4–7.7)
Neutrophils Relative %: 59.5 % (ref 43.0–77.0)
PLATELETS: 219 10*3/uL (ref 150.0–400.0)
RBC: 4.26 Mil/uL (ref 3.87–5.11)
RDW: 13.4 % (ref 11.5–15.5)
WBC: 5.7 10*3/uL (ref 4.0–10.5)

## 2014-10-13 LAB — LIPID PANEL
CHOL/HDL RATIO: 2
Cholesterol: 148 mg/dL (ref 0–200)
HDL: 95.2 mg/dL (ref >39.00)
LDL CALC: 47 mg/dL (ref 0–99)
NONHDL: 52.8
Triglycerides: 29 mg/dL (ref 0.0–149.0)
VLDL: 5.8 mg/dL (ref 0.0–40.0)

## 2014-10-13 LAB — TSH: TSH: 17.54 u[IU]/mL — ABNORMAL HIGH (ref 0.35–4.50)

## 2014-10-13 LAB — COMPREHENSIVE METABOLIC PANEL
ALK PHOS: 42 U/L (ref 39–117)
ALT: 12 U/L (ref 0–35)
AST: 19 U/L (ref 0–37)
Albumin: 4.3 g/dL (ref 3.5–5.2)
BUN: 14 mg/dL (ref 6–23)
CALCIUM: 9 mg/dL (ref 8.4–10.5)
CO2: 27 mEq/L (ref 19–32)
Chloride: 103 mEq/L (ref 96–112)
Creatinine, Ser: 0.79 mg/dL (ref 0.40–1.20)
GFR: 79.66 mL/min (ref >60.00)
Glucose, Bld: 104 mg/dL — ABNORMAL HIGH (ref 70–99)
Potassium: 4.1 mEq/L (ref 3.5–5.1)
Sodium: 138 mEq/L (ref 135–145)
Total Bilirubin: 0.5 mg/dL (ref 0.2–1.2)
Total Protein: 6.9 g/dL (ref 6.0–8.3)

## 2014-10-13 LAB — HEMOGLOBIN A1C: Hgb A1c MFr Bld: 6.1 % (ref 4.6–6.5)

## 2014-10-14 ENCOUNTER — Other Ambulatory Visit: Payer: Self-pay | Admitting: Internal Medicine

## 2014-10-14 MED ORDER — LEVOTHYROXINE SODIUM 50 MCG PO TABS: 50.0000 ug | ORAL_TABLET | Freq: Every day | ORAL | Status: DC

## 2014-10-14 NOTE — Progress Notes (Signed)
Synthroid sent in rx for #30 with 2 refills.

## 2014-10-14 NOTE — Progress Notes (Signed)
Pt scheduled 3.1.15 @ 8:45am

## 2014-10-20 ENCOUNTER — Other Ambulatory Visit: Payer: No Typology Code available for payment source

## 2014-10-21 ENCOUNTER — Encounter: Payer: Self-pay | Admitting: Internal Medicine

## 2014-10-21 ENCOUNTER — Other Ambulatory Visit (INDEPENDENT_AMBULATORY_CARE_PROVIDER_SITE_OTHER): Payer: No Typology Code available for payment source

## 2014-10-21 ENCOUNTER — Other Ambulatory Visit: Payer: Self-pay | Admitting: *Deleted

## 2014-10-21 DIAGNOSIS — Z1211 Encounter for screening for malignant neoplasm of colon: Secondary | ICD-10-CM

## 2014-10-21 LAB — FECAL OCCULT BLOOD, IMMUNOCHEMICAL: FECAL OCCULT BLD: NEGATIVE

## 2014-10-27 NOTE — Telephone Encounter (Signed)
Unread mychart message mailed to patient 

## 2014-11-05 ENCOUNTER — Ambulatory Visit (INDEPENDENT_AMBULATORY_CARE_PROVIDER_SITE_OTHER): Payer: No Typology Code available for payment source | Admitting: *Deleted

## 2014-11-05 DIAGNOSIS — Z23 Encounter for immunization: Secondary | ICD-10-CM

## 2014-11-06 ENCOUNTER — Telehealth: Payer: Self-pay

## 2014-11-06 NOTE — Telephone Encounter (Signed)
See if she can come in on 11/13/14 - urgent care f/u and staple removal.  Pt came in yesterday pm for Tetanus.  Had fallen and hit her head.  Was seen at Urgent Care.  Had staples.  I discussed with her yesterday - symptoms to watch for given recent head injury.  Need to confirm pt doing ok today.  (any headache, dizziness, other acute symptoms)

## 2014-11-06 NOTE — Telephone Encounter (Signed)
Appt scheduled & LMTCB

## 2014-11-06 NOTE — Telephone Encounter (Signed)
The patient called and is hoping to have her stiches removed the 17th or 18th.  Do you want her worked in to one of these days?

## 2014-11-07 NOTE — Telephone Encounter (Signed)
LMTCB & asked for a call back to confirm appt & to let me know how she is doing since her fall (any symptoms).

## 2014-11-07 NOTE — Telephone Encounter (Signed)
Pt returned call & confirmed appt & states that she is doing well other than expected soreness at suture site.

## 2014-11-13 ENCOUNTER — Ambulatory Visit (INDEPENDENT_AMBULATORY_CARE_PROVIDER_SITE_OTHER): Payer: No Typology Code available for payment source | Admitting: Internal Medicine

## 2014-11-13 ENCOUNTER — Encounter: Payer: Self-pay | Admitting: Internal Medicine

## 2014-11-13 VITALS — BP 118/70 | HR 61 | Temp 98.3°F | Ht 68.75 in | Wt 165.5 lb

## 2014-11-13 DIAGNOSIS — T148 Other injury of unspecified body region: Secondary | ICD-10-CM

## 2014-11-13 DIAGNOSIS — IMO0002 Reserved for concepts with insufficient information to code with codable children: Secondary | ICD-10-CM

## 2014-11-13 DIAGNOSIS — S0990XD Unspecified injury of head, subsequent encounter: Secondary | ICD-10-CM

## 2014-11-13 NOTE — Progress Notes (Signed)
Pre visit review using our clinic review tool, if applicable. No additional management support is needed unless otherwise documented below in the visit note. 

## 2014-11-16 NOTE — Progress Notes (Signed)
Patient ID: Jodi PlaneLucy T Campbell, female   DOB: [birth year], 59 y.o.   MRN: [id number]   Subjective:    Patient ID: Jodi PlaneLucy T Campbell, female    DOB: [birth year], 59 y.o.   MRN: [id number]  HPI  Patient here for an ER follow up.  Was playing racket ball last week and fell.  Hit her head.  Laceration - top of her head.  To ER.  Had seven staples placed.  She needs her staples removed.  No headache.  No light headedness.  No dizziness.  Played again this week.  No vision change.  No nausea or vomiting.  The only thing she has noticed is "missing some points" while playing a card game.     Past Medical History  Diagnosis Date  . Graves disease   . Lichen planus     Current Outpatient Prescriptions on File Prior to Visit  Medication Sig Dispense Refill  . levothyroxine (SYNTHROID) 50 MCG tablet Take 1 tablet (50 mcg total) by mouth daily before breakfast. 30 tablet 2  . Misc Natural Products (NF FORMULAS TESTOSTERONE) CAPS Use as directed 20 capsule 2  . progesterone (PROMETRIUM) 100 MG capsule TAKE ONE (1) TO TWO (2) CAPSULES BY MOUTH AT BEDTIME AS DIRECTED. 60 capsule 2   No current facility-administered medications on file prior to visit.    Review of Systems  Constitutional: Negative for appetite change and unexpected weight change.  HENT: Negative for congestion and sinus pressure.   Eyes: Negative for visual disturbance.  Respiratory: Negative for chest tightness and shortness of breath.   Gastrointestinal: Negative for nausea and vomiting.  Neurological: Negative for dizziness, light-headedness and headaches.  Psychiatric/Behavioral:       Reports noticing being a little more moody.         Objective:    Physical Exam  Constitutional: She appears well-developed and well-nourished. No distress.  Laceration - top of head.  Seven staples placed.  Removed without problems.  No surrounding erythema.   Neck: Neck supple.  Pulmonary/Chest: Breath sounds normal.  Abdominal: Soft. Bowel  sounds are normal.    BP 118/70 mmHg  Pulse 61  Temp(Src) 98.3 F (36.8 C) (Oral)  Ht 5' 8.75" (1.746 m)  Wt 165 lb 8 oz (75.07 kg)  BMI 24.63 kg/m2  SpO2 98%  LMP 09/18/2003 Wt Readings from Last 3 Encounters:  11/13/14 165 lb 8 oz (75.07 kg)  09/29/14 165 lb 8 oz (75.07 kg)  09/24/13 166 lb 12 oz (75.637 kg)     Lab Results  Component Value Date   WBC 5.7 10/13/2014   HGB 12.6 10/13/2014   HCT 38.3 10/13/2014   PLT 219.0 10/13/2014   GLUCOSE 104* 10/13/2014   CHOL 148 10/13/2014   TRIG 29.0 10/13/2014   HDL 95.20 10/13/2014   LDLCALC 47 10/13/2014   ALT 12 10/13/2014   AST 19 10/13/2014   NA 138 10/13/2014   K 4.1 10/13/2014   CL 103 10/13/2014   CREATININE 0.79 10/13/2014   BUN 14 10/13/2014   CO2 27 10/13/2014   TSH 17.54* 10/13/2014   HGBA1C 6.1 10/13/2014        Assessment & Plan:   Problem List Items Addressed This Visit    Head injury    No headache or dizziness. One week s/p injury.  Instructed on signs/symptoms to follow.  Notify me or be reevaluated if any change or problems.        Laceration - Primary    Staples  removed without problems.  Follow.         I spent 15 minutes with the patient and more than 50% of the time was spent in consultation regarding the above.     Dale [place], MD

## 2014-11-16 NOTE — Assessment & Plan Note (Signed)
No headache or dizziness. One week s/p injury.  Instructed on signs/symptoms to follow.  Notify me or be reevaluated if any change or problems.

## 2014-11-16 NOTE — Assessment & Plan Note (Signed)
Staples removed without problems.  Follow.

## 2014-11-25 ENCOUNTER — Other Ambulatory Visit (INDEPENDENT_AMBULATORY_CARE_PROVIDER_SITE_OTHER): Payer: No Typology Code available for payment source

## 2014-11-25 ENCOUNTER — Telehealth: Payer: Self-pay | Admitting: *Deleted

## 2014-11-25 DIAGNOSIS — E039 Hypothyroidism, unspecified: Secondary | ICD-10-CM

## 2014-11-25 LAB — TSH: TSH: 3.59 u[IU]/mL (ref 0.35–4.50)

## 2014-11-25 NOTE — Telephone Encounter (Signed)
Order placed for tsh 

## 2014-11-25 NOTE — Telephone Encounter (Signed)
Labs and dx?  

## 2014-11-26 ENCOUNTER — Telehealth: Payer: Self-pay | Admitting: Internal Medicine

## 2014-11-26 ENCOUNTER — Encounter: Payer: Self-pay | Admitting: Internal Medicine

## 2014-11-26 DIAGNOSIS — E039 Hypothyroidism, unspecified: Secondary | ICD-10-CM

## 2014-11-26 NOTE — Telephone Encounter (Signed)
Pt notified of lab results via my chart.  Needs non fasting lab appt in 3 months.  Please schedule and contact her with an appt date and time.  Thanks

## 2014-11-28 ENCOUNTER — Encounter: Payer: Self-pay | Admitting: Internal Medicine

## 2015-01-05 ENCOUNTER — Other Ambulatory Visit: Payer: Self-pay | Admitting: Internal Medicine

## 2015-03-03 ENCOUNTER — Other Ambulatory Visit (INDEPENDENT_AMBULATORY_CARE_PROVIDER_SITE_OTHER): Payer: No Typology Code available for payment source

## 2015-03-03 DIAGNOSIS — E039 Hypothyroidism, unspecified: Secondary | ICD-10-CM | POA: Diagnosis not present

## 2015-03-03 LAB — TSH: TSH: 3.08 u[IU]/mL (ref 0.35–4.50)

## 2015-03-04 ENCOUNTER — Encounter: Payer: Self-pay | Admitting: Internal Medicine

## 2015-03-31 ENCOUNTER — Encounter: Payer: Self-pay | Admitting: Internal Medicine

## 2015-04-02 ENCOUNTER — Encounter: Payer: Self-pay | Admitting: Internal Medicine

## 2015-04-02 ENCOUNTER — Ambulatory Visit (INDEPENDENT_AMBULATORY_CARE_PROVIDER_SITE_OTHER): Payer: No Typology Code available for payment source | Admitting: Internal Medicine

## 2015-04-02 VITALS — BP 115/71 | HR 53 | Temp 98.3°F | Resp 18 | Ht 68.75 in | Wt 167.1 lb

## 2015-04-02 DIAGNOSIS — L439 Lichen planus, unspecified: Secondary | ICD-10-CM

## 2015-04-02 DIAGNOSIS — E0500 Thyrotoxicosis with diffuse goiter without thyrotoxic crisis or storm: Secondary | ICD-10-CM

## 2015-04-02 DIAGNOSIS — R221 Localized swelling, mass and lump, neck: Secondary | ICD-10-CM | POA: Diagnosis not present

## 2015-04-02 DIAGNOSIS — E0789 Other specified disorders of thyroid: Secondary | ICD-10-CM | POA: Diagnosis not present

## 2015-04-02 NOTE — Progress Notes (Signed)
Pre visit review using our clinic review tool, if applicable. No additional management support is needed unless otherwise documented below in the visit note. 

## 2015-04-05 ENCOUNTER — Encounter: Payer: Self-pay | Admitting: Internal Medicine

## 2015-04-05 DIAGNOSIS — E0789 Other specified disorders of thyroid: Secondary | ICD-10-CM | POA: Insufficient documentation

## 2015-04-05 NOTE — Progress Notes (Signed)
[name], female   DOB: 13-Dec-1955, 59 y.o.   MRN: [id number]   Subjective:    [name], female    DOB: [birth year], 59 y.o.   MRN: [id number]  HPI  Patient here as a work in with concerns regarding pain in one spot - neck.  States started one month ago.  Intermittent.  Is sore to touch.  Describes the discomfort as a dull ache - like a tooth ache.  When looks down, will notice.  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  No trouble swallowing.  No fever.  No sore throat.  Eating and drinking well.  No other problems.  Recently started on synthroid.  Last tsh wnl.     Past Medical History  Diagnosis Date  . Graves disease   . Lichen planus     Current Outpatient Prescriptions on File Prior to Visit  Medication Sig Dispense Refill  . levothyroxine (SYNTHROID, LEVOTHROID) 50 MCG tablet TAKE ONE (1) TABLET EACH DAY BEFORE BREAKFAST 30 tablet 6  . Misc Natural Products (NF FORMULAS TESTOSTERONE) CAPS Use as directed 20 capsule 2  . progesterone (PROMETRIUM) 100 MG capsule TAKE ONE (1) TO TWO (2) CAPSULES BY MOUTH AT BEDTIME AS DIRECTED. 60 capsule 2   No current facility-administered medications on file prior to visit.    Review of Systems  Constitutional: Negative for appetite change, fatigue and unexpected weight change.  HENT: Negative for congestion, sinus pressure and sore throat.   Respiratory: Negative for cough, chest tightness and shortness of breath.   Cardiovascular: Negative for chest pain, palpitations and leg swelling.  Gastrointestinal: Negative for nausea, abdominal pain and diarrhea.  Skin: Negative for color change and rash.       Objective:    Physical Exam  Constitutional: She appears well-developed and well-nourished. No distress.  HENT:  Nose: Nose normal.  Mouth/Throat: Oropharynx is clear and moist.  Neck: Neck supple.  Increased fullness noticed - left thyroid.  Also point tenderness anterior neck.  No increased erythema.      Cardiovascular: Normal rate and regular rhythm.   Pulmonary/Chest: Breath sounds normal. No respiratory distress. She has no wheezes.  Abdominal: Soft. Bowel sounds are normal.  Musculoskeletal: She exhibits no edema.  Lymphadenopathy:    She has no cervical adenopathy.  Skin: No rash noted.    BP 115/71 mmHg  Pulse 53  Temp(Src) 98.3 F (36.8 C) (Oral)  Resp 18  Ht 5' 8.75" (1.746 m)  Wt 167 lb 2 oz (75.807 kg)  BMI 24.87 kg/m2  SpO2 100%  LMP 09/18/2003 Wt Readings from Last 3 Encounters:  04/02/15 167 lb 2 oz (75.807 kg)  11/13/14 165 lb 8 oz (75.07 kg)  09/29/14 165 lb 8 oz (75.07 kg)     Lab Results  Component Value Date   WBC 5.7 10/13/2014   HGB 12.6 10/13/2014   HCT 38.3 10/13/2014   PLT 219.0 10/13/2014   GLUCOSE 104* 10/13/2014   CHOL 148 10/13/2014   TRIG 29.0 10/13/2014   HDL 95.20 10/13/2014   LDLCALC 47 10/13/2014   ALT 12 10/13/2014   AST 19 10/13/2014   NA 138 10/13/2014   K 4.1 10/13/2014   CL 103 10/13/2014   CREATININE 0.79 10/13/2014   BUN 14 10/13/2014   CO2 27 10/13/2014   TSH 3.08 03/03/2015   HGBA1C 6.1 10/13/2014       Assessment & Plan:   Problem List Items Addressed This Visit  Graves disease    Just recently started on thyroid replacement.  Las tsh wnl.  Recheck in the near future.       Lichen planus    Stable now.  Recent worsening.       Thyroid fullness    Exam as outlined.  Point tenderness.  Some fullness also noted - left lobe.  Check thyroid ultrasound.         Other Visit Diagnoses    Neck nodule    -  Primary    Relevant Orders    US Soft Tissue Head/Neck        Dale DurhamSCOTT, Dvaughn Fickle, MD

## 2015-04-05 NOTE — Assessment & Plan Note (Signed)
Stable now.  Recent worsening.

## 2015-04-05 NOTE — Assessment & Plan Note (Signed)
Exam as outlined.  Point tenderness.  Some fullness also noted - left lobe.  Check thyroid ultrasound.

## 2015-04-05 NOTE — Assessment & Plan Note (Signed)
Just recently started on thyroid replacement.  Las tsh wnl.  Recheck in the near future.

## 2015-04-14 ENCOUNTER — Ambulatory Visit
Admission: RE | Admit: 2015-04-14 | Discharge: 2015-04-14 | Disposition: A | Discharge status: Home / Self Care | Payer: No Typology Code available for payment source | Source: Ambulatory Visit | Attending: Internal Medicine | Admitting: Internal Medicine

## 2015-04-14 DIAGNOSIS — R221 Localized swelling, mass and lump, neck: Secondary | ICD-10-CM | POA: Insufficient documentation

## 2015-04-15 ENCOUNTER — Encounter: Payer: Self-pay | Admitting: Internal Medicine

## 2015-04-15 ENCOUNTER — Other Ambulatory Visit: Payer: Self-pay | Admitting: Internal Medicine

## 2015-04-15 DIAGNOSIS — E039 Hypothyroidism, unspecified: Secondary | ICD-10-CM

## 2015-04-15 NOTE — Progress Notes (Signed)
Order placed for f/u labs.  

## 2015-04-16 NOTE — Telephone Encounter (Signed)
Unread mychart message mailed to patient 

## 2015-04-23 ENCOUNTER — Encounter: Payer: Self-pay | Admitting: Internal Medicine

## 2015-04-23 DIAGNOSIS — Z1273 Encounter for screening for malignant neoplasm of ovary: Secondary | ICD-10-CM

## 2015-04-23 DIAGNOSIS — Z8041 Family history of malignant neoplasm of ovary: Secondary | ICD-10-CM

## 2015-04-23 NOTE — Telephone Encounter (Signed)
Added ca 125 to labs to be drawn.

## 2015-04-29 ENCOUNTER — Other Ambulatory Visit (INDEPENDENT_AMBULATORY_CARE_PROVIDER_SITE_OTHER): Payer: No Typology Code available for payment source

## 2015-04-29 DIAGNOSIS — E039 Hypothyroidism, unspecified: Secondary | ICD-10-CM

## 2015-04-29 DIAGNOSIS — Z8041 Family history of malignant neoplasm of ovary: Secondary | ICD-10-CM

## 2015-04-29 DIAGNOSIS — Z1273 Encounter for screening for malignant neoplasm of ovary: Secondary | ICD-10-CM

## 2015-04-29 LAB — TSH: TSH: 0.88 u[IU]/mL (ref 0.35–4.50)

## 2015-04-29 LAB — T4, FREE: Free T4: 1.03 ng/dL (ref 0.60–1.60)

## 2015-04-30 ENCOUNTER — Encounter: Payer: Self-pay | Admitting: Internal Medicine

## 2015-04-30 LAB — CA 125: CA 125: 11 U/mL (ref <35)

## 2015-07-31 ENCOUNTER — Encounter: Payer: Self-pay | Admitting: Internal Medicine

## 2015-07-31 NOTE — Telephone Encounter (Signed)
Rx placed in blue folder

## 2015-07-31 NOTE — Telephone Encounter (Signed)
I am ok to refill, but need to get rx sent over from pharmacy (this is a compound medication and they have to send script for me to sign).  Thanks.

## 2015-08-03 ENCOUNTER — Encounter: Payer: Self-pay | Admitting: Internal Medicine

## 2015-08-03 ENCOUNTER — Other Ambulatory Visit: Payer: Self-pay

## 2015-08-03 ENCOUNTER — Other Ambulatory Visit: Payer: Self-pay | Admitting: Internal Medicine

## 2015-08-03 MED ORDER — LEVOTHYROXINE SODIUM 50 MCG PO TABS: ORAL_TABLET | ORAL | Status: DC

## 2015-08-14 ENCOUNTER — Telehealth: Payer: Self-pay | Admitting: Internal Medicine

## 2015-08-14 NOTE — Telephone Encounter (Signed)
Spoke with patient she gave me her symptoms which were runny nose and coughing for 2 days. I advised her to treat symptoms with OTC meds and if she did feel anybody she should be seen in a couple of days.

## 2015-08-14 NOTE — Telephone Encounter (Signed)
Pt called about her job wanting to her to see her Doctor due to having a cold. Pt wanted to know is it true to only treat the symptoms? Pt states her job wants her to have a antibiotic? She was told maybe she is getting sick due to her Thyroid is that true? Thank You!

## 2015-08-19 ENCOUNTER — Ambulatory Visit (INDEPENDENT_AMBULATORY_CARE_PROVIDER_SITE_OTHER): Payer: 59 | Admitting: Family Medicine

## 2015-08-19 ENCOUNTER — Encounter: Payer: Self-pay | Admitting: Family Medicine

## 2015-08-19 VITALS — BP 112/80 | HR 66 | Temp 98.9°F | Ht 68.75 in | Wt 169.5 lb

## 2015-08-19 DIAGNOSIS — R05 Cough: Secondary | ICD-10-CM | POA: Diagnosis not present

## 2015-08-19 DIAGNOSIS — R059 Cough, unspecified: Secondary | ICD-10-CM

## 2015-08-19 MED ORDER — HYDROCODONE-HOMATROPINE 5-1.5 MG/5ML PO SYRP: 5.0000 mL | ORAL_SOLUTION | Freq: Three times a day (TID) | ORAL | Status: DC | PRN

## 2015-08-19 NOTE — Progress Notes (Signed)
Pre visit review using our clinic review tool, if applicable. No additional management support is needed unless otherwise documented below in the visit note. 

## 2015-08-19 NOTE — Patient Instructions (Addendum)
It was nice to see you today.  Use the cough syrup as needed.  If you fail to improve please let me know.  Follow up:  Return if symptoms worsen or fail to improve.  Take care  Dr. Adriana Simasook

## 2015-08-19 NOTE — Progress Notes (Signed)
   Subjective:  [name], female    DOB: Dec 29, 1955  Age: 59 y.o. MRN: [id number]  CC: Cough  HPI:  59 year old female presents to clinic today with complaints of sore throat and cough. Her primary complaint is the cough. Patient states that she had a cold about 3 weeks ago. She states she's had resurgence of her symptoms over the past week to week and a half. She states that the cough is particularly troublesome at night. Mildly productive. She has taken some Sudafed with no relief. No associated fevers or chills. No shortness of breath.  Social Hx   Social History   Social History  . Marital Status: Single    Spouse Name: N/A  . Number of Children: 1  . Years of Education: N/A   Occupational History  .     Social History Main Topics  . Smoking status: Former Games developermoker  . Smokeless tobacco: Never Used     Comment: smoked from age [age]  . Alcohol Use: 0.0 oz/week    0 Standard drinks or equivalent per week     Comment: daily glass of wine  . Drug Use: No  . Sexual Activity: Not Asked   Other Topics Concern  . None   Social History Narrative   Review of Systems  Constitutional: Negative for fever and chills.  Respiratory: Positive for cough.    Objective:  BP 112/80 mmHg  Pulse 66  Temp(Src) 98.9 F (37.2 C) (Oral)  Ht 5' 8.75" (1.746 m)  Wt 169 lb 8 oz (76.885 kg)  BMI 25.22 kg/m2  SpO2 99%  LMP 09/18/2003  BP/Weight 08/19/2015 04/02/2015 11/13/2014  Systolic BP 112 115 118  Diastolic BP 80 71 70  Wt. (Lbs) 169.5 167.13 165.5  BMI 25.22 24.87 24.63   Physical Exam  Constitutional: She appears well-developed. No distress.  HENT:  Head: Normocephalic and atraumatic.  Right Ear: External ear normal.  Left Ear: External ear normal.  Mouth/Throat: No oropharyngeal exudate.  Normal TM's bilaterally.   Cardiovascular: Normal rate and regular rhythm.   Pulmonary/Chest: Effort normal. She has no wheezes. She has no rales.  Neurological: She is  alert.  Psychiatric: She has a normal mood and affect.  Vitals reviewed.  Lab Results  Component Value Date   WBC 5.7 10/13/2014   HGB 12.6 10/13/2014   HCT 38.3 10/13/2014   PLT 219.0 10/13/2014   GLUCOSE 104* 10/13/2014   CHOL 148 10/13/2014   TRIG 29.0 10/13/2014   HDL 95.20 10/13/2014   LDLCALC 47 10/13/2014   ALT 12 10/13/2014   AST 19 10/13/2014   NA 138 10/13/2014   K 4.1 10/13/2014   CL 103 10/13/2014   CREATININE 0.79 10/13/2014   BUN 14 10/13/2014   CO2 27 10/13/2014   TSH 0.88 04/29/2015   HGBA1C 6.1 10/13/2014   Assessment & Plan:   Problem List Items Addressed This Visit    Cough - Primary    No indication for antibiotics. Treating with supportive care and Hycodan for cough.         Meds ordered this encounter  Medications  . HYDROcodone-homatropine (HYCODAN) 5-1.5 MG/5ML syrup    Sig: Take 5 mLs by mouth every 8 (eight) hours as needed for cough.    Dispense:  120 mL    Refill:  0    Follow-up: Return if symptoms worsen or fail to improve.  Jodi OtherJayce Burle Kwan DO [facility] [place]

## 2015-08-19 NOTE — Assessment & Plan Note (Signed)
No indication for antibiotics. Treating with supportive care and Hycodan for cough.

## 2015-10-05 ENCOUNTER — Encounter: Payer: Self-pay | Admitting: Internal Medicine

## 2015-10-05 ENCOUNTER — Other Ambulatory Visit (HOSPITAL_COMMUNITY)
Admission: RE | Admit: 2015-10-05 | Discharge: 2015-10-05 | Disposition: A | Discharge status: Home / Self Care | Payer: 59 | Source: Ambulatory Visit | Attending: Internal Medicine | Admitting: Internal Medicine

## 2015-10-05 ENCOUNTER — Ambulatory Visit (INDEPENDENT_AMBULATORY_CARE_PROVIDER_SITE_OTHER): Payer: 59 | Admitting: Internal Medicine

## 2015-10-05 VITALS — BP 120/70 | HR 64 | Temp 98.2°F | Resp 18 | Ht 68.75 in | Wt 170.4 lb

## 2015-10-05 DIAGNOSIS — R739 Hyperglycemia, unspecified: Secondary | ICD-10-CM

## 2015-10-05 DIAGNOSIS — Z1151 Encounter for screening for human papillomavirus (HPV): Secondary | ICD-10-CM | POA: Insufficient documentation

## 2015-10-05 DIAGNOSIS — M25562 Pain in left knee: Secondary | ICD-10-CM | POA: Diagnosis not present

## 2015-10-05 DIAGNOSIS — Z1239 Encounter for other screening for malignant neoplasm of breast: Secondary | ICD-10-CM

## 2015-10-05 DIAGNOSIS — E0500 Thyrotoxicosis with diffuse goiter without thyrotoxic crisis or storm: Secondary | ICD-10-CM

## 2015-10-05 DIAGNOSIS — Z8041 Family history of malignant neoplasm of ovary: Secondary | ICD-10-CM

## 2015-10-05 DIAGNOSIS — Z124 Encounter for screening for malignant neoplasm of cervix: Secondary | ICD-10-CM | POA: Diagnosis not present

## 2015-10-05 DIAGNOSIS — Z13220 Encounter for screening for lipoid disorders: Secondary | ICD-10-CM

## 2015-10-05 DIAGNOSIS — L439 Lichen planus, unspecified: Secondary | ICD-10-CM

## 2015-10-05 DIAGNOSIS — R221 Localized swelling, mass and lump, neck: Secondary | ICD-10-CM

## 2015-10-05 DIAGNOSIS — Z01419 Encounter for gynecological examination (general) (routine) without abnormal findings: Secondary | ICD-10-CM | POA: Insufficient documentation

## 2015-10-05 DIAGNOSIS — Z0000 Encounter for general adult medical examination without abnormal findings: Secondary | ICD-10-CM | POA: Diagnosis not present

## 2015-10-05 NOTE — Progress Notes (Signed)
Patient ID: SHONIKA KOLASINSKI, female   DOB: [birth year], 60 y.o.   MRN: [id number]   Subjective:    Patient ID: TAMINA CYPHERS, female    DOB: 01-May-1956, 60 y.o.   MRN: [id number]  HPI  Patient with past history of graves disease with resulting hypothyroidism and lichen planus.  She comes in today to follow up on these issues as well as for a complete physical exam.  She was playing racquet ball and kick boxing.  Noticed some left knee pain.  Hurt her knee to bend.  Stopped kick boxing and the knee is some better, but is still an issue.  Increased knee discomfort.  Tries to stay active.  No chest pain or tightness.  No sob.  No acid reflux.  No abdominal pain or cramping.  Bowels stable.  Has noticed some fullness - left side neck.  She also informs me that her sister has been diagnosed with breast cancer and ovarian cancer.  Also had maternal aunt and maternal grandmother with breast cancer and mother with ovarian cancer.  Discussed genetic testing.     Past Medical History  Diagnosis Date  . Graves disease   . Lichen planus    Past Surgical History  Procedure Laterality Date  . Abdominal hysterectomy  2004   Family History  Problem Relation Age of Onset  . Goiter Mother   . Mental illness Mother   . Ovarian cancer Mother   . Goiter Sister   . Breast cancer Neg Hx   . Colon cancer Neg Hx   . Kidney cancer      aunt   Social History   Social History  . Marital Status: Single    Spouse Name: N/A  . Number of Children: 1  . Years of Education: N/A   Occupational History  .     Social History Main Topics  . Smoking status: Former Games developer  . Smokeless tobacco: Never Used     Comment: smoked from age [age]  . Alcohol Use: 0.0 oz/week    0 Standard drinks or equivalent per week     Comment: daily glass of wine  . Drug Use: No  . Sexual Activity: Not Asked   Other Topics Concern  . None   Social History Narrative    Outpatient Encounter Prescriptions as of 10/05/2015    Medication Sig  . levothyroxine (SYNTHROID, LEVOTHROID) 50 MCG tablet TAKE 1 TABLET BY MOUTH DAILY BEFORE BREAKFAST  . Misc Natural Products (NF FORMULAS TESTOSTERONE) CAPS Use as directed  . progesterone (PROMETRIUM) 100 MG capsule TAKE ONE (1) TO TWO (2) CAPSULES BY MOUTH AT BEDTIME AS DIRECTED.  . [DISCONTINUED] HYDROcodone-homatropine (HYCODAN) 5-1.5 MG/5ML syrup Take 5 mLs by mouth every 8 (eight) hours as needed for cough.   No facility-administered encounter medications on file as of 10/05/2015.    Review of Systems  Constitutional: Negative for appetite change and unexpected weight change.  HENT: Negative for congestion and sinus pressure.   Eyes: Negative for pain and visual disturbance.  Respiratory: Negative for cough, chest tightness and shortness of breath.   Cardiovascular: Negative for chest pain, palpitations and leg swelling.  Gastrointestinal: Negative for nausea, vomiting, abdominal pain and diarrhea.  Genitourinary: Negative for dysuria and difficulty urinating.  Musculoskeletal: Negative for back pain and joint swelling.       Persistent left knee pain as outlined.    Skin: Negative for color change and rash.  Neurological: Negative for dizziness, light-headedness and headaches.  Hematological: Negative for adenopathy. Does not bruise/bleed easily.  Psychiatric/Behavioral: Negative for dysphoric mood and agitation.       Objective:    Physical Exam  Constitutional: She is oriented to person, place, and time. She appears well-developed and well-nourished. No distress.  HENT:  Nose: Nose normal.  Mouth/Throat: Oropharynx is clear and moist.  Eyes: Right eye exhibits no discharge. Left eye exhibits no discharge. No scleral icterus.  Neck: Neck supple.  Cardiovascular: Normal rate and regular rhythm.   Pulmonary/Chest: Breath sounds normal. No accessory muscle usage. No tachypnea. No respiratory distress. She has no decreased breath sounds. She has no wheezes. She  has no rhonchi. Right breast exhibits no inverted nipple, no mass, no nipple discharge and no tenderness (no axillary adenopathy). Left breast exhibits no inverted nipple, no mass, no nipple discharge and no tenderness (no axilarry adenopathy).  Abdominal: Soft. Bowel sounds are normal. There is no tenderness.  Genitourinary:  Normal external genitalia.  Vaginal vault without lesions.  Cervix identified.  Pap smear performed.  Could not appreciate any adnexal masses or tenderness.    Musculoskeletal: She exhibits no edema or tenderness.  Lymphadenopathy:    She has no cervical adenopathy.  Neurological: She is alert and oriented to person, place, and time.  Skin: Skin is warm. No rash noted. No erythema.  Psychiatric: She has a normal mood and affect. Her behavior is normal.    BP 120/70 mmHg  Pulse 64  Temp(Src) 98.2 F (36.8 C) (Oral)  Resp 18  Ht 5' 8.75" (1.746 m)  Wt 170 lb 6 oz (77.282 kg)  BMI 25.35 kg/m2  SpO2 98%  LMP 09/18/2003 Wt Readings from Last 3 Encounters:  10/05/15 170 lb 6 oz (77.282 kg)  08/19/15 169 lb 8 oz (76.885 kg)  04/02/15 167 lb 2 oz (75.807 kg)     Lab Results  Component Value Date   WBC 5.7 10/13/2014   HGB 12.6 10/13/2014   HCT 38.3 10/13/2014   PLT 219.0 10/13/2014   GLUCOSE 104* 10/13/2014   CHOL 148 10/13/2014   TRIG 29.0 10/13/2014   HDL 95.20 10/13/2014   LDLCALC 47 10/13/2014   ALT 12 10/13/2014   AST 19 10/13/2014   NA 138 10/13/2014   K 4.1 10/13/2014   CL 103 10/13/2014   CREATININE 0.79 10/13/2014   BUN 14 10/13/2014   CO2 27 10/13/2014   TSH 0.88 04/29/2015   HGBA1C 6.1 10/13/2014    US Soft Tissue Head/neck  04/14/2015  CLINICAL DATA:  60 year old female with painful right neck nodule and history of hypothyroidism EXAM: THYROID ULTRASOUND TECHNIQUE: Ultrasound examination of the thyroid gland and adjacent soft tissues was performed. COMPARISON:  None. FINDINGS: Right thyroid lobe Measurements: 4.6 x 1.1 x 1.3 cm.  Diffusely heterogeneous and slightly hypoechoic thyroid parenchyma. No discrete nodule. Left thyroid lobe Measurements: 3.8 x 1.2 x 1.4 cm. Diffusely heterogeneous and slightly hypoechoic thyroid parenchyma. No discrete nodule. Isthmus Thickness: 0.4 cm.  No nodules visualized. Lymphadenopathy None visualized. IMPRESSION: Diffusely heterogeneous and hypoechoic thyroid parenchyma. Imaging findings are nonspecific and can be seen in the setting of subacute or chronic thyroiditis as well as chronic hypothyroidism on thyroid hormone replacement therapy. No discrete nodules identified. Electronically Signed   By: Malachy Moan M.D.   On: 04/14/2015 13:46       Assessment & Plan:   Problem List Items Addressed This Visit    Family history of ovarian cancer    Family history of ovarian and breast  cancer.  Refer for genetic testing.        Relevant Orders   Ambulatory referral to Genetics   Graves disease    On thyroid replacement.  Follow tsh.        Relevant Orders   TSH   Left knee pain    Persistent.  Check xray.        Relevant Orders   DG Knee 1-2 Views Left   Lichen planus    Stable.        Neck fullness    She feels at times an area of more fullness when she swallows.  Cannot appreciate on today's exam.  Thyroid ultrasound as outlined.  Will refer to ENT.       Relevant Orders   Ambulatory referral to ENT   CBC with Differential/Platelet   Comprehensive metabolic panel    Other Visit Diagnoses    Routine general medical examination at a health care facility    -  Primary    Screening breast examination        Relevant Orders    MM DIGITAL SCREENING BILATERAL    Cervical cancer screening        Relevant Orders    Cytology - PAP (Completed)    Hyperglycemia        Relevant Orders    Hemoglobin A1c    Screening cholesterol level        Relevant Orders    Lipid panel        Dale DurhamSCOTT, Mccayla Shimada, MD

## 2015-10-05 NOTE — Progress Notes (Signed)
Pre-visit discussion using our clinic review tool. No additional management support is needed unless otherwise documented below in the visit note.  

## 2015-10-06 LAB — CYTOLOGY - PAP

## 2015-10-11 ENCOUNTER — Encounter: Payer: Self-pay | Admitting: Internal Medicine

## 2015-10-11 DIAGNOSIS — R221 Localized swelling, mass and lump, neck: Secondary | ICD-10-CM | POA: Insufficient documentation

## 2015-10-11 DIAGNOSIS — Z8041 Family history of malignant neoplasm of ovary: Secondary | ICD-10-CM | POA: Insufficient documentation

## 2015-10-11 NOTE — Assessment & Plan Note (Signed)
She feels at times an area of more fullness when she swallows.  Cannot appreciate on today's exam.  Thyroid ultrasound as outlined.  Will refer to ENT.

## 2015-10-11 NOTE — Assessment & Plan Note (Signed)
Persistent.  Check xray.   

## 2015-10-11 NOTE — Assessment & Plan Note (Signed)
Family history of ovarian and breast cancer.  Refer for genetic testing.

## 2015-10-11 NOTE — Assessment & Plan Note (Signed)
On thyroid replacement.  Follow tsh.  

## 2015-10-11 NOTE — Assessment & Plan Note (Signed)
Stable

## 2015-10-12 ENCOUNTER — Encounter: Payer: Self-pay | Admitting: Internal Medicine

## 2015-10-14 ENCOUNTER — Ambulatory Visit
Admission: RE | Admit: 2015-10-14 | Discharge: 2015-10-14 | Disposition: A | Discharge status: Home / Self Care | Payer: 59 | Source: Ambulatory Visit | Attending: Internal Medicine | Admitting: Internal Medicine

## 2015-10-14 DIAGNOSIS — Z1231 Encounter for screening mammogram for malignant neoplasm of breast: Secondary | ICD-10-CM | POA: Insufficient documentation

## 2015-10-14 DIAGNOSIS — Z1239 Encounter for other screening for malignant neoplasm of breast: Secondary | ICD-10-CM

## 2015-10-15 ENCOUNTER — Encounter: Payer: Self-pay | Admitting: Internal Medicine

## 2015-10-15 DIAGNOSIS — Z0000 Encounter for general adult medical examination without abnormal findings: Secondary | ICD-10-CM | POA: Insufficient documentation

## 2015-10-16 ENCOUNTER — Encounter: Payer: Self-pay | Admitting: Family Medicine

## 2015-10-16 ENCOUNTER — Encounter: Payer: Self-pay | Admitting: Internal Medicine

## 2015-10-16 DIAGNOSIS — Z1211 Encounter for screening for malignant neoplasm of colon: Secondary | ICD-10-CM

## 2015-10-16 DIAGNOSIS — Z1159 Encounter for screening for other viral diseases: Secondary | ICD-10-CM

## 2015-10-19 NOTE — Telephone Encounter (Signed)
Order placed for hepatitis C ab lab draw and for gastro referral for screening colonoscopy.

## 2015-10-23 ENCOUNTER — Other Ambulatory Visit (INDEPENDENT_AMBULATORY_CARE_PROVIDER_SITE_OTHER): Payer: 59

## 2015-10-23 DIAGNOSIS — E0500 Thyrotoxicosis with diffuse goiter without thyrotoxic crisis or storm: Secondary | ICD-10-CM

## 2015-10-23 DIAGNOSIS — Z1159 Encounter for screening for other viral diseases: Secondary | ICD-10-CM

## 2015-10-23 DIAGNOSIS — R739 Hyperglycemia, unspecified: Secondary | ICD-10-CM

## 2015-10-23 DIAGNOSIS — Z13220 Encounter for screening for lipoid disorders: Secondary | ICD-10-CM | POA: Diagnosis not present

## 2015-10-23 DIAGNOSIS — R221 Localized swelling, mass and lump, neck: Secondary | ICD-10-CM | POA: Diagnosis not present

## 2015-10-23 LAB — HEMOGLOBIN A1C: Hgb A1c MFr Bld: 5.7 % (ref 4.6–6.5)

## 2015-10-23 LAB — LIPID PANEL
CHOL/HDL RATIO: 2
Cholesterol: 152 mg/dL (ref 0–200)
HDL: 96.7 mg/dL (ref >39.00)
LDL Cholesterol: 50 mg/dL (ref 0–99)
NONHDL: 54.85
TRIGLYCERIDES: 23 mg/dL (ref 0.0–149.0)
VLDL: 4.6 mg/dL (ref 0.0–40.0)

## 2015-10-23 LAB — CBC WITH DIFFERENTIAL/PLATELET
BASOS ABS: 0 10*3/uL (ref 0.0–0.1)
Basophils Relative: 0.8 % (ref 0.0–3.0)
Eosinophils Absolute: 0.1 10*3/uL (ref 0.0–0.7)
Eosinophils Relative: 1.9 % (ref 0.0–5.0)
HCT: 39.4 % (ref 36.0–46.0)
HEMOGLOBIN: 13.1 g/dL (ref 12.0–15.0)
LYMPHS ABS: 2 10*3/uL (ref 0.7–4.0)
LYMPHS PCT: 35.4 % (ref 12.0–46.0)
MCHC: 33.2 g/dL (ref 30.0–36.0)
MCV: 88.6 fl (ref 78.0–100.0)
MONOS PCT: 6.2 % (ref 3.0–12.0)
Monocytes Absolute: 0.4 10*3/uL (ref 0.1–1.0)
NEUTROS PCT: 55.7 % (ref 43.0–77.0)
Neutro Abs: 3.2 10*3/uL (ref 1.4–7.7)
Platelets: 217 10*3/uL (ref 150.0–400.0)
RBC: 4.45 Mil/uL (ref 3.87–5.11)
RDW: 13.4 % (ref 11.5–15.5)
WBC: 5.7 10*3/uL (ref 4.0–10.5)

## 2015-10-23 LAB — COMPREHENSIVE METABOLIC PANEL
ALBUMIN: 4.5 g/dL (ref 3.5–5.2)
ALK PHOS: 43 U/L (ref 39–117)
ALT: 14 U/L (ref 0–35)
AST: 19 U/L (ref 0–37)
BUN: 12 mg/dL (ref 6–23)
CO2: 31 mEq/L (ref 19–32)
Calcium: 9.3 mg/dL (ref 8.4–10.5)
Chloride: 103 mEq/L (ref 96–112)
Creatinine, Ser: 0.75 mg/dL (ref 0.40–1.20)
GFR: 84.28 mL/min (ref >60.00)
GLUCOSE: 101 mg/dL — AB (ref 70–99)
Potassium: 4.2 mEq/L (ref 3.5–5.1)
Sodium: 139 mEq/L (ref 135–145)
Total Bilirubin: 0.7 mg/dL (ref 0.2–1.2)
Total Protein: 7 g/dL (ref 6.0–8.3)

## 2015-10-23 LAB — TSH: TSH: 3.28 u[IU]/mL (ref 0.35–4.50)

## 2015-10-24 LAB — HEPATITIS C ANTIBODY: HCV Ab: NEGATIVE

## 2015-10-25 ENCOUNTER — Encounter: Payer: Self-pay | Admitting: Internal Medicine

## 2015-10-28 NOTE — Telephone Encounter (Signed)
Unread mychart message mailed to patient 

## 2015-11-26 ENCOUNTER — Encounter: Payer: Self-pay | Admitting: Internal Medicine

## 2015-11-27 ENCOUNTER — Other Ambulatory Visit: Payer: Self-pay

## 2015-11-27 MED ORDER — PROGESTERONE MICRONIZED 100 MG PO CAPS: ORAL_CAPSULE | ORAL | Status: DC

## 2015-11-27 NOTE — Telephone Encounter (Signed)
Ok to hold on knee xray.  Need to clarify how she was taking the progesterone and ok to refill x 2

## 2015-11-30 ENCOUNTER — Other Ambulatory Visit: Payer: Self-pay

## 2015-11-30 MED ORDER — PROGESTERONE MICRONIZED 100 MG PO CAPS: ORAL_CAPSULE | ORAL | Status: DC

## 2015-12-15 ENCOUNTER — Inpatient Hospital Stay: Payer: 59

## 2015-12-15 ENCOUNTER — Inpatient Hospital Stay: Payer: 59 | Attending: Oncology | Admitting: Oncology

## 2015-12-15 ENCOUNTER — Encounter: Payer: Self-pay | Admitting: Oncology

## 2015-12-15 VITALS — BP 156/84 | HR 66 | Temp 97.7°F | Resp 18 | Ht 69.0 in | Wt 165.3 lb

## 2015-12-15 DIAGNOSIS — Z8041 Family history of malignant neoplasm of ovary: Secondary | ICD-10-CM | POA: Diagnosis not present

## 2015-12-15 DIAGNOSIS — IMO0002 Reserved for concepts with insufficient information to code with codable children: Secondary | ICD-10-CM

## 2015-12-15 DIAGNOSIS — E0500 Thyrotoxicosis with diffuse goiter without thyrotoxic crisis or storm: Secondary | ICD-10-CM | POA: Insufficient documentation

## 2015-12-15 DIAGNOSIS — Z315 Encounter for genetic counseling: Secondary | ICD-10-CM | POA: Diagnosis present

## 2015-12-15 DIAGNOSIS — Z803 Family history of malignant neoplasm of breast: Secondary | ICD-10-CM | POA: Diagnosis not present

## 2015-12-15 DIAGNOSIS — Z79899 Other long term (current) drug therapy: Secondary | ICD-10-CM | POA: Diagnosis not present

## 2015-12-15 DIAGNOSIS — Z8051 Family history of malignant neoplasm of kidney: Secondary | ICD-10-CM

## 2015-12-15 DIAGNOSIS — Z87891 Personal history of nicotine dependence: Secondary | ICD-10-CM | POA: Diagnosis not present

## 2015-12-15 NOTE — Progress Notes (Signed)
Concerned about family hx of cancers.  Here for Genetic testing

## 2015-12-25 ENCOUNTER — Encounter: Payer: Self-pay | Admitting: *Deleted

## 2015-12-28 ENCOUNTER — Ambulatory Visit: Payer: Commercial Managed Care - HMO | Admitting: Anesthesiology

## 2015-12-28 ENCOUNTER — Ambulatory Visit
Admission: RE | Admit: 2015-12-28 | Discharge: 2015-12-28 | Disposition: A | Discharge status: Home Health | Payer: Commercial Managed Care - HMO | Source: Ambulatory Visit | Attending: Gastroenterology | Admitting: Gastroenterology

## 2015-12-28 ENCOUNTER — Encounter: Payer: Self-pay | Admitting: *Deleted

## 2015-12-28 ENCOUNTER — Encounter
Admission: RE | Disposition: A | Discharge status: Home Health | Payer: Self-pay | Source: Ambulatory Visit | Attending: Gastroenterology

## 2015-12-28 DIAGNOSIS — Z87891 Personal history of nicotine dependence: Secondary | ICD-10-CM | POA: Insufficient documentation

## 2015-12-28 DIAGNOSIS — Z888 Allergy status to other drugs, medicaments and biological substances status: Secondary | ICD-10-CM | POA: Insufficient documentation

## 2015-12-28 DIAGNOSIS — Z1211 Encounter for screening for malignant neoplasm of colon: Secondary | ICD-10-CM | POA: Insufficient documentation

## 2015-12-28 DIAGNOSIS — Z880 Allergy status to penicillin: Secondary | ICD-10-CM | POA: Diagnosis not present

## 2015-12-28 DIAGNOSIS — Z79899 Other long term (current) drug therapy: Secondary | ICD-10-CM | POA: Diagnosis not present

## 2015-12-28 DIAGNOSIS — E0500 Thyrotoxicosis with diffuse goiter without thyrotoxic crisis or storm: Secondary | ICD-10-CM | POA: Insufficient documentation

## 2015-12-28 DIAGNOSIS — Z886 Allergy status to analgesic agent status: Secondary | ICD-10-CM | POA: Diagnosis not present

## 2015-12-28 HISTORY — PX: COLONOSCOPY WITH PROPOFOL: SHX5780

## 2015-12-28 SURGERY — COLONOSCOPY WITH PROPOFOL
Anesthesia: General

## 2015-12-28 MED ORDER — MIDAZOLAM HCL 2 MG/2ML IJ SOLN
INTRAMUSCULAR | Status: DC | PRN
  Administered 2015-12-28: 1 mg via INTRAVENOUS

## 2015-12-28 MED ORDER — SODIUM CHLORIDE 0.9 % IV SOLN
INTRAVENOUS | Status: DC
  Administered 2015-12-28: 11:00:00 via INTRAVENOUS

## 2015-12-28 MED ORDER — SODIUM CHLORIDE 0.9 % IV SOLN: INTRAVENOUS | Status: DC

## 2015-12-28 MED ORDER — LIDOCAINE HCL (CARDIAC) 20 MG/ML IV SOLN
INTRAVENOUS | Status: DC | PRN
  Administered 2015-12-28: 60 mg via INTRAVENOUS

## 2015-12-28 MED ORDER — PROPOFOL 10 MG/ML IV BOLUS
INTRAVENOUS | Status: DC | PRN
  Administered 2015-12-28: 50 mg via INTRAVENOUS

## 2015-12-28 MED ORDER — PROPOFOL 500 MG/50ML IV EMUL
INTRAVENOUS | Status: DC | PRN
  Administered 2015-12-28: 120 ug/kg/min via INTRAVENOUS

## 2015-12-28 NOTE — H&P (Signed)
    Primary Care Physician:  Dale DurhamSCOTT, CHARLENE, MD Primary Gastroenterologist:  Dr. Bluford Kaufmannh  Pre-Procedure History & Physical: HPI:  Jodi Stewart is a 60 y.o. female is here for an colonoscopy.  Past Medical History  Diagnosis Date  . Graves disease   . Lichen planus     Past Surgical History  Procedure Laterality Date  . Abdominal hysterectomy  2004  . Breast biopsy Left 2002    needle bx    Prior to Admission medications   Medication Sig Start Date End Date Taking? Authorizing Provider  levothyroxine (SYNTHROID, LEVOTHROID) 50 MCG tablet TAKE 1 TABLET BY MOUTH DAILY BEFORE BREAKFAST 08/03/15  Yes Dale Durhamharlene Scott, MD  progesterone (PROMETRIUM) 100 MG capsule TAKE ONE (1) TO TWO (2) CAPSULES BY MOUTH AT BEDTIME AS DIRECTED. 11/30/15  Yes Dale Durhamharlene Scott, MD  Misc Natural Products (NF FORMULAS TESTOSTERONE) CAPS Use as directed Patient not taking: Reported on 12/15/2015 09/29/14   Dale Durhamharlene Scott, MD    Allergies as of 12/18/2015 - Review Complete 12/15/2015  Allergen Reaction Noted  . Aspirin  09/17/2012  . Monistat [miconazole]  09/17/2012  . Penicillins  09/17/2012    Family History  Problem Relation Age of Onset  . Goiter Mother   . Mental illness Mother   . Ovarian cancer Mother   . Goiter Sister   . Colon cancer Neg Hx   . Breast cancer Maternal Aunt 60  . Breast cancer Maternal Grandmother 80  . Cancer Maternal Aunt [id number]    Renal Cancer    Social History   Social History  . Marital Status: Married    Spouse Name: N/A  . Number of Children: 1  . Years of Education: N/A   Occupational History  .     Social History Main Topics  . Smoking status: Former Smoker    Quit date: 08/16/1985  . Smokeless tobacco: Never Used     Comment: smoked from age [age]  . Alcohol Use: 0.0 oz/week    0 Standard drinks or equivalent per week     Comment: daily glass of wine  . Drug Use: No  . Sexual Activity: Not on file   Other Topics Concern  . Not on file   Social History  Narrative    Review of Systems: See HPI, otherwise negative ROS  Physical Exam: LMP 09/18/2003 General:   Alert,  pleasant and cooperative in NAD Head:  Normocephalic and atraumatic. Neck:  Supple; no masses or thyromegaly. Lungs:  Clear throughout to auscultation.    Heart:  Regular rate and rhythm. Abdomen:  Soft, nontender and nondistended. Normal bowel sounds, without guarding, and without rebound.   Neurologic:  Alert and  oriented x4;  grossly normal neurologically.  Impression/Plan: Jodi Stewart is here for an colonoscopy to be performed for screening.  Risks, benefits, limitations, and alternatives regarding colonoscopy have been reviewed with the patient.  Questions have been answered.  All parties agreeable.   Jodi Stewart, Ezzard StandingPAUL Y, MD  12/28/2015, 10:55 AM

## 2015-12-28 NOTE — Anesthesia Preprocedure Evaluation (Signed)
Anesthesia Evaluation  Patient identified by MRN, date of birth, ID band Patient awake    Reviewed: Allergy & Precautions, H&P , NPO status , Patient's Chart, lab work & pertinent test results, reviewed documented beta blocker date and time   History of Anesthesia Complications Negative for: history of anesthetic complications  Airway Mallampati: II  TM Distance: >3 FB Neck ROM: full    Dental no notable dental hx. (+) Caps, Teeth Intact   Pulmonary neg pulmonary ROS, former smoker,    Pulmonary exam normal breath sounds clear to auscultation       Cardiovascular Exercise Tolerance: Good negative cardio ROS Normal cardiovascular exam Rhythm:regular Rate:Normal     Neuro/Psych negative neurological ROS  negative psych ROS   GI/Hepatic negative GI ROS, Neg liver ROS,   Endo/Other  neg diabetesHyperthyroidism   Renal/GU negative Renal ROS  negative genitourinary   Musculoskeletal   Abdominal   Peds  Hematology negative hematology ROS (+)   Anesthesia Other Findings Past Medical History:   Graves disease                                               Lichen planus                                                Reproductive/Obstetrics negative OB ROS                             Anesthesia Physical Anesthesia Plan  ASA: II  Anesthesia Plan: General   Post-op Pain Management:    Induction:   Airway Management Planned:   Additional Equipment:   Intra-op Plan:   Post-operative Plan:   Informed Consent: I have reviewed the patients History and Physical, chart, labs and discussed the procedure including the risks, benefits and alternatives for the proposed anesthesia with the patient or authorized representative who has indicated his/her understanding and acceptance.   Dental Advisory Given  Plan Discussed with: Anesthesiologist, CRNA and Surgeon  Anesthesia Plan Comments:          Anesthesia Quick Evaluation

## 2015-12-28 NOTE — Op Note (Signed)
[facility] Gastroenterology Patient Name: Jodi MiniumLucy Stewart Procedure Date: 12/28/2015 11:33 AM MRN: [id number] Account #: [id number] Date of Birth: [birth year] Admit Type: Outpatient Age: [id number] [facility] Gender: Female Note Status: Finalized Procedure:            Colonoscopy Indications:          Screening for colorectal malignant neoplasm Providers:            Ezzard StandingPaul Y. Bluford Kaufmannh, MD Referring MD:         Dale Durhamharlene Scott, MD (Referring MD) Medicines:            Monitored Anesthesia Care Complications:        No immediate complications. Procedure:            Pre-Anesthesia Assessment:                       - Prior to the procedure, a History and Physical was                        performed, and patient medications, allergies and                        sensitivities were reviewed. The patient's tolerance of                        previous anesthesia was reviewed.                       - The risks and benefits of the procedure and the                        sedation options and risks were discussed with the                        patient. All questions were answered and informed                        consent was obtained.                       - After reviewing the risks and benefits, the patient                        was deemed in satisfactory condition to undergo the                        procedure.                       After obtaining informed consent, the colonoscope was                        passed under direct vision. Throughout the procedure,                        the patient's blood pressure, pulse, and oxygen                        saturations were monitored continuously. The  Colonoscope was introduced through the anus and                        advanced to the the cecum, identified by appendiceal                        orifice and ileocecal valve. The colonoscopy was                        performed without difficulty. The  patient tolerated the                        procedure well. The quality of the bowel preparation                        was good. Findings:      The colon (entire examined portion) appeared normal. Impression:           - The entire examined colon is normal.                       - No specimens collected. Recommendation:       - Discharge patient to home.                       - Repeat colonoscopy in 10 years for surveillance.                       - The findings and recommendations were discussed with                        the patient. Procedure Code(s):    --- Professional ---                       [phone], Colonoscopy, flexible; diagnostic, including                        collection of specimen(s) by brushing or washing, when                        performed (separate procedure) Diagnosis Code(s):    --- Professional ---                       Z12.11, Encounter for screening for malignant neoplasm                        of colon CPT copyright 2016 [employer]. All rights reserved. The codes documented in this report are preliminary and upon coder review may  be revised to meet current compliance requirements. Wallace Cullens, MD 12/28/2015 11:49:36 AM This report has been signed electronically. Number of Addenda: 0 Note Initiated On: 12/28/2015 11:33 AM Scope Withdrawal Time: 0 hours 3 minutes 57 seconds  Total Procedure Duration: 0 hours 8 minutes 5 seconds       [facility]

## 2015-12-28 NOTE — Progress Notes (Signed)
[facility]  [phone] Fax:(336) [phone]  ID: Jodi TREGO OB: 07-Jul-1956  MR#: [id number]  WUX#:[id number]  Patient Care Team: Einar Pheasant, MD as PCP - General (Internal Medicine)  CHIEF COMPLAINT:  Chief Complaint  Patient presents with  . New Evaluation    Genectic testing Family hx ovarian     INTERVAL HISTORY: Patient referred to clinic today for evaluation and consideration of genetic testing for family history of ovarian and breast cancer. She is also interested in any preventative measures she can take. Currently, she feels well and is asymptomatic. She has no neurologic complaints. She denies any recent fevers or illnesses. She has good appetite and denies weight loss. She denies any pain. She denies any chest pain or shortness of breath. She has no nausea, vomiting, constipation, or diarrhea. She has no urinary complaints. Patient feels at her baseline and offers no specific complaints today.  REVIEW OF SYSTEMS:   Review of Systems  Constitutional: Negative.   Respiratory: Negative.   Cardiovascular: Negative.   Gastrointestinal: Negative.   Genitourinary: Negative.   Musculoskeletal: Negative.   Neurological: Negative.     As per HPI. Otherwise, a complete review of systems is negatve.  PAST MEDICAL HISTORY: Past Medical History  Diagnosis Date  . Graves disease   . Lichen planus     PAST SURGICAL HISTORY: Past Surgical History  Procedure Laterality Date  . Abdominal hysterectomy  2004  . Breast biopsy Left 2002    needle bx    FAMILY HISTORY Family History  Problem Relation Age of Onset  . Goiter Mother   . Mental illness Mother   . Ovarian cancer Mother   . Goiter Sister   . Colon cancer Neg Hx   . Breast cancer Maternal Aunt 60  . Breast cancer Maternal Grandmother 80  . Cancer Maternal Aunt 58    Renal Cancer       ADVANCED DIRECTIVES:    HEALTH MAINTENANCE: Social History  Substance Use Topics    . Smoking status: Former Smoker    Quit date: 08/16/1985  . Smokeless tobacco: Never Used     Comment: smoked from age [age]  . Alcohol Use: 0.0 oz/week    0 Standard drinks or equivalent per week     Comment: daily glass of wine     Colonoscopy:  PAP:  Bone density:  Lipid panel:  Allergies  Allergen Reactions  . Aspirin   . Monistat [Miconazole]   . Penicillins     Current Outpatient Prescriptions  Medication Sig Dispense Refill  . levothyroxine (SYNTHROID, LEVOTHROID) 50 MCG tablet TAKE 1 TABLET BY MOUTH DAILY BEFORE BREAKFAST 30 tablet 8  . progesterone (PROMETRIUM) 100 MG capsule TAKE ONE (1) TO TWO (2) CAPSULES BY MOUTH AT BEDTIME AS DIRECTED. 60 capsule 2  . Misc Natural Products (NF FORMULAS TESTOSTERONE) CAPS Use as directed (Patient not taking: Reported on 12/15/2015) 20 capsule 2   No current facility-administered medications for this visit.    OBJECTIVE: Filed Vitals:   12/15/15 1125  BP: 156/84  Pulse: 66  Temp: 97.7 F (36.5 C)  Resp: 18     Body mass index is 24.41 kg/(m^2).    ECOG FS:0 - Asymptomatic  General: Well-developed, well-nourished, no acute distress. Eyes: Pink conjunctiva, anicteric sclera. Musculoskeletal: No edema, cyanosis, or clubbing. Neuro: Alert, answering all questions appropriately. Cranial nerves grossly intact. Skin: No rashes or petechiae noted. Psych: Normal affect.  LAB RESULTS:  Lab Results  Component Value  Date   NA 139 10/23/2015   K 4.2 10/23/2015   CL 103 10/23/2015   CO2 31 10/23/2015   GLUCOSE 101* 10/23/2015   BUN 12 10/23/2015   CREATININE 0.75 10/23/2015   CALCIUM 9.3 10/23/2015   PROT 7.0 10/23/2015   ALBUMIN 4.5 10/23/2015   AST 19 10/23/2015   ALT 14 10/23/2015   ALKPHOS 43 10/23/2015   BILITOT 0.7 10/23/2015    Lab Results  Component Value Date   WBC 5.7 10/23/2015   NEUTROABS 3.2 10/23/2015   HGB 13.1 10/23/2015   HCT 39.4 10/23/2015   MCV 88.6 10/23/2015   PLT 217.0 10/23/2015      STUDIES: No results found.  ASSESSMENT: Significant family history of ovarian and breast cancer.  PLAN:    1. Genetic testing: Patient has a mother who is deceased from ovarian cancer at the age of 59. Her sister has been diagnosed with both breast and ovarian cancer and is currently alive at the age of 59. Maternal grandmother as well as maternal aunt both deceased from breast cancer. She also had a second maternal aunt that is deceased from renal cell carcinoma. Patient has no personal history of malignancy. She has one son with no grandchildren. Given her significant family history of both ovarian and breast cancer, patient is eligible for genetic testing for BRCA1 and 2.  If patient is positive, she will return to clinic for further evaluation and discussion of preventative measures. If negative, she understands that she has had high risk for both breast and ovarian cancer over the general population. She has been instructed that she should continue with her routine yearly mammograms. Patient has a history of a hysterectomy, but still has her ovaries in place. She has considered having repeat surgery to remove her ovaries to further reduce her risk and prevent ovarian cancer. No follow-up has been scheduled at this time, we will await results of her genetic testing.  Approximately 45 minutes was spent in discussion of which greater than 50% was consultation.   Patient expressed understanding and was in agreement with this plan. She also understands that She can call clinic at any time with any questions, concerns, or complaints.   Lloyd Huger, MD   12/28/2015 10:13 AM

## 2015-12-28 NOTE — Anesthesia Postprocedure Evaluation (Signed)
Anesthesia Post Note  Patient: Jodi Stewart  Procedure(s) Performed: Procedure(s) (LRB): COLONOSCOPY WITH PROPOFOL (N/A)  Patient location during evaluation: Endoscopy Anesthesia Type: General Level of consciousness: awake and alert Pain management: pain level controlled Vital Signs Assessment: post-procedure vital signs reviewed and stable Respiratory status: spontaneous breathing, nonlabored ventilation, respiratory function stable and patient connected to nasal cannula oxygen Cardiovascular status: blood pressure returned to baseline and stable Postop Assessment: no signs of nausea or vomiting Anesthetic complications: no    Last Vitals:  Filed Vitals:   12/28/15 1223 12/28/15 1232  BP: 111/71 129/83  Pulse: 59 60  Temp:    Resp: 13 18    Last Pain: There were no vitals filed for this visit.               Lenard SimmerAndrew Tequia Wolman

## 2015-12-28 NOTE — Transfer of Care (Signed)
Immediate Anesthesia Transfer of Care Note  Patient: Jodi Stewart  Procedure(s) Performed: Procedure(s): COLONOSCOPY WITH PROPOFOL (N/A)  Patient Location: PACU  Anesthesia Type:General  Level of Consciousness: awake and patient cooperative  Airway & Oxygen Therapy: Patient Spontanous Breathing and Patient connected to nasal cannula oxygen  Post-op Assessment: Report given to RN  Post vital signs: Reviewed and stable  Last Vitals:  Filed Vitals:   12/28/15 1051 12/28/15 1154  BP: 128/65 109/62  Pulse: 60 58  Temp: 37.2 C 96.44F  Resp: 16 18    Complications: No apparent anesthesia complications

## 2015-12-29 ENCOUNTER — Encounter: Payer: Self-pay | Admitting: Gastroenterology

## 2016-03-08 ENCOUNTER — Encounter: Payer: 59 | Admitting: Family

## 2016-03-08 DIAGNOSIS — Z0289 Encounter for other administrative examinations: Secondary | ICD-10-CM

## 2016-03-08 NOTE — Progress Notes (Signed)
   Subjective:    [name], female    DOB: Jul 15, 1956, 60 y.o.   MRN: [id number]   Jodi Stewart is a 60 y.o. female who presents today for an acute visit.    HPI Past Medical History  Diagnosis Date  . Graves disease   . Lichen planus    Allergies: Aspirin; Monistat; and Penicillins Current Outpatient Prescriptions on File Prior to Visit  Medication Sig Dispense Refill  . levothyroxine (SYNTHROID, LEVOTHROID) 50 MCG tablet TAKE 1 TABLET BY MOUTH DAILY BEFORE BREAKFAST 30 tablet 8  . Misc Natural Products (NF FORMULAS TESTOSTERONE) CAPS Use as directed (Patient not taking: Reported on 12/15/2015) 20 capsule 2  . progesterone (PROMETRIUM) 100 MG capsule TAKE ONE (1) TO TWO (2) CAPSULES BY MOUTH AT BEDTIME AS DIRECTED. 60 capsule 2   No current facility-administered medications on file prior to visit.    Social History  Substance Use Topics  . Smoking status: Former Smoker    Quit date: 08/16/1985  . Smokeless tobacco: Never Used     Comment: smoked from age [age]  . Alcohol Use: 0.0 oz/week    0 Standard drinks or equivalent per week     Comment: daily glass of wine    Review of Systems    Objective:    LMP 09/18/2003   Physical Exam     Assessment & Plan:      I am having Ms. Orvan Falconerampbell maintain her NF FORMULAS TESTOSTERONE, levothyroxine, and progesterone.   No orders of the defined types were placed in this encounter.     Start medications as prescribed and explained to patient on After Visit Summary ( AVS). Risks, benefits, and alternatives of the medications and treatment plan prescribed today were discussed, and patient expressed understanding.   Education regarding symptom management and diagnosis given to patient.   Follow-up:Plan follow-up and return precautions given if any worsening symptoms or change in condition.   Continue to follow with Dale DurhamSCOTT, CHARLENE, MD for routine health maintenance.   Jodi Stewart and I agreed  with plan.   Rennie PlowmanMargaret Arnett, FNP    This encounter was created in error - please disregard.

## 2016-04-13 ENCOUNTER — Ambulatory Visit: Payer: 59 | Admitting: Internal Medicine

## 2016-04-19 ENCOUNTER — Ambulatory Visit (INDEPENDENT_AMBULATORY_CARE_PROVIDER_SITE_OTHER): Payer: 59 | Admitting: Internal Medicine

## 2016-04-19 ENCOUNTER — Encounter: Payer: Self-pay | Admitting: Internal Medicine

## 2016-04-19 DIAGNOSIS — E0500 Thyrotoxicosis with diffuse goiter without thyrotoxic crisis or storm: Secondary | ICD-10-CM

## 2016-04-19 DIAGNOSIS — Z8041 Family history of malignant neoplasm of ovary: Secondary | ICD-10-CM

## 2016-04-19 DIAGNOSIS — L439 Lichen planus, unspecified: Secondary | ICD-10-CM | POA: Diagnosis not present

## 2016-04-19 NOTE — Progress Notes (Signed)
[name], female   DOB: [birth year], 60 y.o.   MRN: [id number]   Subjective:    [name], female    DOB: [birth year], 60 y.o.   MRN: [id number]  HPI  Patient here for a scheduled follow up.  States she is doing well.  Feels good.  Still exercising.  Has also added yoga.  Knee is better.  No chest pain.  No sob.  No abdominal pain or cramping.  Bowel stable.  Stress is better.    Past Medical History:  Diagnosis Date  . Graves disease   . Lichen planus    Past Surgical History:  Procedure Laterality Date  . ABDOMINAL HYSTERECTOMY  2004  . BREAST BIOPSY Left 2002   needle bx  . COLONOSCOPY WITH PROPOFOL N/A 12/28/2015   Procedure: COLONOSCOPY WITH PROPOFOL;  Surgeon: Wallace Cullens, MD;  Location: [facility];  Service: Gastroenterology;  Laterality: N/A;   Family History  Problem Relation Age of Onset  . Goiter Mother   . Mental illness Mother   . Ovarian cancer Mother   . Breast cancer Maternal Aunt 60  . Breast cancer Maternal Grandmother 80  . Goiter Sister   . Cancer Maternal Aunt 66    Renal Cancer  . Colon cancer Neg Hx    Social History   Social History  . Marital status: Married    Spouse name: N/A  . Number of children: 1  . Years of education: N/A   Occupational History  .  [employer]   Social History Main Topics  . Smoking status: Former Smoker    Quit date: 08/16/1985  . Smokeless tobacco: Never Used     Comment: smoked from age [age]  . Alcohol use 0.0 oz/week     Comment: daily glass of wine  . Drug use: No  . Sexual activity: Not Asked   Other Topics Concern  . None   Social History Narrative  . None    Outpatient Encounter Prescriptions as of 04/19/2016  Medication Sig  . levothyroxine (SYNTHROID, LEVOTHROID) 50 MCG tablet TAKE 1 TABLET BY MOUTH DAILY BEFORE BREAKFAST  . Misc Natural Products (NF FORMULAS TESTOSTERONE) CAPS Use as directed  . progesterone (PROMETRIUM) 100 MG capsule TAKE ONE (1) TO TWO (2)  CAPSULES BY MOUTH AT BEDTIME AS DIRECTED.   No facility-administered encounter medications on file as of 04/19/2016.     Review of Systems  Constitutional: Negative for appetite change and unexpected weight change.  HENT: Negative for congestion and sinus pressure.   Respiratory: Negative for cough, chest tightness and shortness of breath.   Cardiovascular: Negative for chest pain, palpitations and leg swelling.  Gastrointestinal: Negative for abdominal pain, diarrhea, nausea and vomiting.  Genitourinary: Negative for difficulty urinating and dysuria.  Musculoskeletal: Negative for back pain and joint swelling.  Neurological: Negative for dizziness, light-headedness and headaches.  Psychiatric/Behavioral: Negative for agitation and dysphoric mood.       Objective:    Physical Exam  Constitutional: She appears well-developed and well-nourished. No distress.  HENT:  Nose: Nose normal.  Mouth/Throat: Oropharynx is clear and moist.  Neck: Neck supple. No thyromegaly present.  Cardiovascular: Normal rate and regular rhythm.   Pulmonary/Chest: Breath sounds normal. No respiratory distress. She has no wheezes.  Abdominal: Soft. Bowel sounds are normal. There is no tenderness.  Musculoskeletal: She exhibits no edema or tenderness.  Lymphadenopathy:    She has no cervical adenopathy.  Skin: No rash noted. No  erythema.  Psychiatric: She has a normal mood and affect. Her behavior is normal.    BP 120/60   Pulse (!) 58   Wt 166 lb (75.3 kg)   LMP 09/18/2003   SpO2 96%   BMI 24.51 kg/m  Wt Readings from Last 3 Encounters:  04/19/16 166 lb (75.3 kg)  12/15/15 165 lb 5.5 oz (75 kg)  10/05/15 170 lb 6 oz (77.3 kg)     Lab Results  Component Value Date   WBC 5.7 10/23/2015   HGB 13.1 10/23/2015   HCT 39.4 10/23/2015   PLT 217.0 10/23/2015   GLUCOSE 101 (H) 10/23/2015   CHOL 152 10/23/2015   TRIG 23.0 10/23/2015   HDL 96.70 10/23/2015   LDLCALC 50 10/23/2015   ALT 14  10/23/2015   AST 19 10/23/2015   NA 139 10/23/2015   K 4.2 10/23/2015   CL 103 10/23/2015   CREATININE 0.75 10/23/2015   BUN 12 10/23/2015   CO2 31 10/23/2015   TSH 3.28 10/23/2015   HGBA1C 5.7 10/23/2015       Assessment & Plan:   Problem List Items Addressed This Visit    Family history of ovarian cancer    Referred for genetic testing.  See note.  Follow. Desires no further intervention at this time.        Graves disease    On thyroid replacement.  Follow tsh.       Lichen planus    Stable.         Other Visit Diagnoses   None.      Dale Cresson, MD

## 2016-04-23 ENCOUNTER — Encounter: Payer: Self-pay | Admitting: Internal Medicine

## 2016-04-23 NOTE — Assessment & Plan Note (Signed)
Stable

## 2016-04-23 NOTE — Assessment & Plan Note (Signed)
Referred for genetic testing.  See note.  Follow. Desires no further intervention at this time.

## 2016-04-23 NOTE — Assessment & Plan Note (Signed)
On thyroid replacement.  Follow tsh.  

## 2016-09-06 ENCOUNTER — Other Ambulatory Visit: Payer: Self-pay | Admitting: Internal Medicine

## 2016-10-18 ENCOUNTER — Ambulatory Visit (INDEPENDENT_AMBULATORY_CARE_PROVIDER_SITE_OTHER): Payer: 59 | Admitting: Internal Medicine

## 2016-10-18 ENCOUNTER — Encounter: Payer: Self-pay | Admitting: Internal Medicine

## 2016-10-18 VITALS — BP 112/84 | HR 63 | Temp 98.3°F | Resp 16 | Ht 69.0 in | Wt 170.1 lb

## 2016-10-18 DIAGNOSIS — E0500 Thyrotoxicosis with diffuse goiter without thyrotoxic crisis or storm: Secondary | ICD-10-CM | POA: Diagnosis not present

## 2016-10-18 DIAGNOSIS — Z1239 Encounter for other screening for malignant neoplasm of breast: Secondary | ICD-10-CM

## 2016-10-18 DIAGNOSIS — Z13220 Encounter for screening for lipoid disorders: Secondary | ICD-10-CM | POA: Diagnosis not present

## 2016-10-18 DIAGNOSIS — Z1231 Encounter for screening mammogram for malignant neoplasm of breast: Secondary | ICD-10-CM

## 2016-10-18 DIAGNOSIS — E0789 Other specified disorders of thyroid: Secondary | ICD-10-CM | POA: Diagnosis not present

## 2016-10-18 DIAGNOSIS — Z8041 Family history of malignant neoplasm of ovary: Secondary | ICD-10-CM

## 2016-10-18 DIAGNOSIS — L439 Lichen planus, unspecified: Secondary | ICD-10-CM

## 2016-10-18 DIAGNOSIS — Z0000 Encounter for general adult medical examination without abnormal findings: Secondary | ICD-10-CM | POA: Diagnosis not present

## 2016-10-18 LAB — LIPID PANEL
CHOLESTEROL: 155 mg/dL (ref 0–200)
HDL: 94.4 mg/dL (ref >39.00)
LDL Cholesterol: 54 mg/dL (ref 0–99)
NonHDL: 60.84
TRIGLYCERIDES: 36 mg/dL (ref 0.0–149.0)
Total CHOL/HDL Ratio: 2
VLDL: 7.2 mg/dL (ref 0.0–40.0)

## 2016-10-18 LAB — CBC WITH DIFFERENTIAL/PLATELET
BASOS PCT: 0.8 % (ref 0.0–3.0)
Basophils Absolute: 0 10*3/uL (ref 0.0–0.1)
EOS PCT: 3 % (ref 0.0–5.0)
Eosinophils Absolute: 0.2 10*3/uL (ref 0.0–0.7)
HEMATOCRIT: 37.8 % (ref 36.0–46.0)
HEMOGLOBIN: 12.7 g/dL (ref 12.0–15.0)
LYMPHS PCT: 25.5 % (ref 12.0–46.0)
Lymphs Abs: 1.4 10*3/uL (ref 0.7–4.0)
MCHC: 33.6 g/dL (ref 30.0–36.0)
MCV: 88.8 fl (ref 78.0–100.0)
Monocytes Absolute: 0.3 10*3/uL (ref 0.1–1.0)
Monocytes Relative: 6 % (ref 3.0–12.0)
NEUTROS ABS: 3.6 10*3/uL (ref 1.4–7.7)
Neutrophils Relative %: 64.7 % (ref 43.0–77.0)
PLATELETS: 202 10*3/uL (ref 150.0–400.0)
RBC: 4.25 Mil/uL (ref 3.87–5.11)
RDW: 13.2 % (ref 11.5–15.5)
WBC: 5.5 10*3/uL (ref 4.0–10.5)

## 2016-10-18 LAB — COMPREHENSIVE METABOLIC PANEL
ALBUMIN: 4.6 g/dL (ref 3.5–5.2)
ALT: 22 U/L (ref 0–35)
AST: 21 U/L (ref 0–37)
Alkaline Phosphatase: 42 U/L (ref 39–117)
BUN: 16 mg/dL (ref 6–23)
CALCIUM: 9.6 mg/dL (ref 8.4–10.5)
CHLORIDE: 104 meq/L (ref 96–112)
CO2: 31 mEq/L (ref 19–32)
Creatinine, Ser: 0.72 mg/dL (ref 0.40–1.20)
GFR: 88.04 mL/min (ref >60.00)
Glucose, Bld: 105 mg/dL — ABNORMAL HIGH (ref 70–99)
POTASSIUM: 4.3 meq/L (ref 3.5–5.1)
Sodium: 140 mEq/L (ref 135–145)
Total Bilirubin: 1 mg/dL (ref 0.2–1.2)
Total Protein: 7.3 g/dL (ref 6.0–8.3)

## 2016-10-18 LAB — TSH: TSH: 1.08 u[IU]/mL (ref 0.35–4.50)

## 2016-10-18 NOTE — Progress Notes (Signed)
Pre-visit discussion using our clinic review tool. No additional management support is needed unless otherwise documented below in the visit note.  

## 2016-10-18 NOTE — Assessment & Plan Note (Addendum)
Physical today 10/18/16.  PAP 10/05/15 - negative with negative HPV.  S/p hysterectomy.  Colonoscopy 12/28/15.  Recommended f/u colonoscopy in 10 years.  Schedule mammogram.  Last 10/14/15 - Birads I.

## 2016-10-18 NOTE — Progress Notes (Signed)
[name], female   DOB: July 12, 1956, 61 y.o.   MRN: [id number]   Subjective:    [name], female    DOB: July 12, 1956, 61 y.o.   MRN: [id number]  HPI  Patient here for her physical exam.  She reports she is doing well.  Is exercising.  No chest pain.  No sob.  No acid reflux.  No abdominal pain or cramping.  Bowels stable.  Doing yoga.  msk issues are better.  No headache.  No dizziness.     Past Medical History:  Diagnosis Date  . Graves disease   . Lichen planus    Past Surgical History:  Procedure Laterality Date  . ABDOMINAL HYSTERECTOMY  2004  . BREAST BIOPSY Left 2002   needle bx  . COLONOSCOPY WITH PROPOFOL N/A 12/28/2015   Procedure: COLONOSCOPY WITH PROPOFOL;  Surgeon: Wallace CullensPaul Y Oh, MD;  [facility];  Service: Gastroenterology;  Laterality: N/A;   Family History  Problem Relation Age of Onset  . Goiter Mother   . Mental illness Mother   . Ovarian cancer Mother   . Breast cancer Maternal Aunt 60  . Breast cancer Maternal Grandmother 80  . Goiter Sister   . Cancer Maternal Aunt [id number]    Renal Cancer  . Colon cancer Neg Hx    Social History   Social History  . Marital status: Married    Spouse name: N/A  . Number of children: 1  . Years of education: N/A   Occupational History  .  [employer]   Social History Main Topics  . Smoking status: Former Smoker    Types: Cigarettes    Quit date: 08/16/1985  . Smokeless tobacco: Never Used     Comment: smoked from age [age]  . Alcohol use 0.0 oz/week     Comment: daily glass of wine  . Drug use: No  . Sexual activity: Not Asked   Other Topics Concern  . None   Social History Narrative  . None    Outpatient Encounter Prescriptions as of 10/18/2016  Medication Sig  . levothyroxine (SYNTHROID, LEVOTHROID) 50 MCG tablet TAKE 1 TABLET BY MOUTH DAILY BEFORE BREAKFAST  . progesterone (PROMETRIUM) 100 MG capsule TAKE ONE (1) TO TWO (2) CAPSULES BY MOUTH AT BEDTIME AS DIRECTED.  .  Misc Natural Products (NF FORMULAS TESTOSTERONE) CAPS Use as directed (Patient not taking: Reported on 10/18/2016)  . [DISCONTINUED] levothyroxine (SYNTHROID, LEVOTHROID) 50 MCG tablet TAKE 1 TABLET BY MOUTH DAILY BEFORE BREAKFAST   No facility-administered encounter medications on file as of 10/18/2016.     Review of Systems  Constitutional: Negative for appetite change and unexpected weight change.  HENT: Negative for congestion and sinus pressure.   Eyes: Negative for pain and visual disturbance.  Respiratory: Negative for cough, chest tightness and shortness of breath.   Cardiovascular: Negative for chest pain, palpitations and leg swelling.  Gastrointestinal: Negative for abdominal pain, diarrhea, nausea and vomiting.  Genitourinary: Negative for difficulty urinating and dysuria.  Musculoskeletal: Negative for back pain and joint swelling.  Skin: Negative for color change and rash.  Neurological: Negative for dizziness, light-headedness and headaches.  Hematological: Negative for adenopathy. Does not bruise/bleed easily.  Psychiatric/Behavioral: Negative for agitation and dysphoric mood.       Objective:    Physical Exam  Constitutional: She is oriented to person, place, and time. She appears well-developed and well-nourished. No distress.  HENT:  Nose: Nose normal.  Mouth/Throat: Oropharynx is clear  and moist.  Eyes: Right eye exhibits no discharge. Left eye exhibits no discharge. No scleral icterus.  Neck: Neck supple. No thyromegaly present.  Cardiovascular: Normal rate and regular rhythm.   Pulmonary/Chest: Breath sounds normal. No accessory muscle usage. No tachypnea. No respiratory distress. She has no decreased breath sounds. She has no wheezes. She has no rhonchi. Right breast exhibits no inverted nipple, no mass, no nipple discharge and no tenderness (no axillary adenopathy). Left breast exhibits no inverted nipple, no mass, no nipple discharge and no tenderness (no  axilarry adenopathy).  Abdominal: Soft. Bowel sounds are normal. There is no tenderness.  Musculoskeletal: She exhibits no edema or tenderness.  Lymphadenopathy:    She has no cervical adenopathy.  Neurological: She is alert and oriented to person, place, and time.  Skin: Skin is warm. No rash noted. No erythema.  Psychiatric: She has a normal mood and affect. Her behavior is normal.    BP 112/84   Pulse 63   Temp 98.3 F (36.8 C) (Oral)   Resp 16   Ht 5\' 9"  (1.753 m)   Wt 170 lb 2 oz (77.2 kg)   LMP 09/18/2003   SpO2 96%   BMI 25.12 kg/m  Wt Readings from Last 3 Encounters:  10/18/16 170 lb 2 oz (77.2 kg)  04/19/16 166 lb (75.3 kg)  12/15/15 165 lb 5.5 oz (75 kg)     Lab Results  Component Value Date   WBC 5.5 10/18/2016   HGB 12.7 10/18/2016   HCT 37.8 10/18/2016   PLT 202.0 10/18/2016   GLUCOSE 105 (H) 10/18/2016   CHOL 155 10/18/2016   TRIG 36.0 10/18/2016   HDL 94.40 10/18/2016   LDLCALC 54 10/18/2016   ALT 22 10/18/2016   AST 21 10/18/2016   NA 140 10/18/2016   K 4.3 10/18/2016   CL 104 10/18/2016   CREATININE 0.72 10/18/2016   BUN 16 10/18/2016   CO2 31 10/18/2016   TSH 1.08 10/18/2016   HGBA1C 5.7 10/23/2015       Assessment & Plan:   Problem List Items Addressed This Visit    Family history of ovarian cancer    Referred for genetic testing.  See note.  Was negative.  Desires no further intervention.  Follow.        Relevant Orders   CA 125 (Completed)   Graves disease    On thyroid replacement.  Follow tsh.       Relevant Orders   CBC with Differential/Platelet (Completed)   Comprehensive metabolic panel (Completed)   TSH (Completed)   Health care maintenance    Physical today 10/18/16.  PAP 10/05/15 - negative with negative HPV.  S/p hysterectomy.  Colonoscopy 12/28/15.  Recommended f/u colonoscopy in 10 years.  Schedule mammogram.  Last 10/14/15 - Birads I.       Lichen planus    Stable.       Thyroid fullness - Primary    Other  Visit Diagnoses    Screening cholesterol level       Relevant Orders   Lipid panel (Completed)   Breast cancer screening       Relevant Orders   MM SCREENING BREAST TOMO BILATERAL       Dale Wheatland, MD

## 2016-10-19 ENCOUNTER — Other Ambulatory Visit: Payer: Self-pay | Admitting: Internal Medicine

## 2016-10-19 DIAGNOSIS — R739 Hyperglycemia, unspecified: Secondary | ICD-10-CM

## 2016-10-19 LAB — CA 125: CA 125: 11 U/mL (ref <35)

## 2016-10-19 NOTE — Progress Notes (Signed)
Order placed for f/u fasting glucose and a1c.  

## 2016-10-20 ENCOUNTER — Ambulatory Visit: Payer: 59 | Admitting: Internal Medicine

## 2016-10-23 ENCOUNTER — Encounter: Payer: Self-pay | Admitting: Internal Medicine

## 2016-10-23 NOTE — Assessment & Plan Note (Signed)
Referred for genetic testing.  See note.  Was negative.  Desires no further intervention.  Follow.

## 2016-10-23 NOTE — Assessment & Plan Note (Signed)
Stable

## 2016-10-23 NOTE — Assessment & Plan Note (Signed)
On thyroid replacement.  Follow tsh.  

## 2016-11-15 ENCOUNTER — Other Ambulatory Visit: Payer: Self-pay | Admitting: Internal Medicine

## 2016-11-15 ENCOUNTER — Ambulatory Visit
Admission: RE | Admit: 2016-11-15 | Discharge: 2016-11-15 | Disposition: A | Discharge status: Home / Self Care | Payer: 59 | Source: Ambulatory Visit | Attending: Internal Medicine | Admitting: Internal Medicine

## 2016-11-15 DIAGNOSIS — R928 Other abnormal and inconclusive findings on diagnostic imaging of breast: Secondary | ICD-10-CM

## 2016-11-15 DIAGNOSIS — Z1239 Encounter for other screening for malignant neoplasm of breast: Secondary | ICD-10-CM

## 2016-11-15 DIAGNOSIS — N6320 Unspecified lump in the left breast, unspecified quadrant: Secondary | ICD-10-CM

## 2016-11-15 DIAGNOSIS — Z1231 Encounter for screening mammogram for malignant neoplasm of breast: Secondary | ICD-10-CM | POA: Insufficient documentation

## 2016-11-15 HISTORY — DX: Diffuse cystic mastopathy of unspecified breast: N60.19

## 2016-11-16 ENCOUNTER — Other Ambulatory Visit (INDEPENDENT_AMBULATORY_CARE_PROVIDER_SITE_OTHER): Payer: 59

## 2016-11-16 DIAGNOSIS — R739 Hyperglycemia, unspecified: Secondary | ICD-10-CM | POA: Diagnosis not present

## 2016-11-16 LAB — HEMOGLOBIN A1C: Hgb A1c MFr Bld: 5.9 % (ref 4.6–6.5)

## 2016-11-17 ENCOUNTER — Encounter: Payer: Self-pay | Admitting: Internal Medicine

## 2016-11-17 LAB — GLUCOSE, FASTING: GLUCOSE, FASTING: 104 mg/dL — AB (ref 65–99)

## 2016-11-18 ENCOUNTER — Other Ambulatory Visit: Payer: Self-pay | Admitting: Internal Medicine

## 2016-11-18 DIAGNOSIS — R928 Other abnormal and inconclusive findings on diagnostic imaging of breast: Secondary | ICD-10-CM

## 2016-11-18 NOTE — Progress Notes (Signed)
Orders placed for f/u left breast mammogram and ultrasound 

## 2016-11-21 ENCOUNTER — Telehealth: Payer: Self-pay | Admitting: Internal Medicine

## 2016-11-21 NOTE — Telephone Encounter (Signed)
Pt requested a call [phone].

## 2016-11-21 NOTE — Telephone Encounter (Signed)
Pt called back she has app on 11-23-16 for her follow up at [facility]. She will call if any questions or problems.

## 2016-11-21 NOTE — Telephone Encounter (Signed)
Pt states she was returning your call from Friday regarding the mammo. Thank you!  Call pt @ [phone].

## 2016-11-23 ENCOUNTER — Ambulatory Visit
Admission: RE | Admit: 2016-11-23 | Discharge: 2016-11-23 | Disposition: A | Discharge status: Home / Self Care | Payer: 59 | Source: Ambulatory Visit | Attending: Internal Medicine | Admitting: Internal Medicine

## 2016-11-23 DIAGNOSIS — N6320 Unspecified lump in the left breast, unspecified quadrant: Secondary | ICD-10-CM

## 2016-11-23 DIAGNOSIS — R928 Other abnormal and inconclusive findings on diagnostic imaging of breast: Secondary | ICD-10-CM

## 2016-12-07 ENCOUNTER — Other Ambulatory Visit: Payer: Self-pay | Admitting: Internal Medicine

## 2017-01-31 ENCOUNTER — Other Ambulatory Visit: Payer: Self-pay

## 2017-01-31 MED ORDER — PROGESTERONE MICRONIZED 100 MG PO CAPS: ORAL_CAPSULE | ORAL | 2 refills | Status: DC

## 2017-01-31 NOTE — Telephone Encounter (Signed)
rx ok'd for progesterone #60 with 2 refills.

## 2017-01-31 NOTE — Telephone Encounter (Signed)
Last OV 10/18/16 last filled 11/30/15 60 2rf

## 2017-03-24 ENCOUNTER — Encounter: Payer: Self-pay | Admitting: Internal Medicine

## 2017-03-24 DIAGNOSIS — R635 Abnormal weight gain: Secondary | ICD-10-CM

## 2017-03-26 NOTE — Telephone Encounter (Signed)
Order placed for referral to a nutritionist per her request via my chart.

## 2017-04-10 ENCOUNTER — Encounter: Payer: 59 | Attending: Internal Medicine | Admitting: Dietician

## 2017-04-10 VITALS — Ht 69.0 in | Wt 170.1 lb

## 2017-04-10 DIAGNOSIS — E663 Overweight: Secondary | ICD-10-CM | POA: Insufficient documentation

## 2017-04-10 DIAGNOSIS — R635 Abnormal weight gain: Secondary | ICD-10-CM | POA: Diagnosis present

## 2017-04-10 DIAGNOSIS — Z6825 Body mass index (BMI) 25.0-25.9, adult: Secondary | ICD-10-CM | POA: Diagnosis not present

## 2017-04-10 DIAGNOSIS — Z713 Dietary counseling and surveillance: Secondary | ICD-10-CM | POA: Insufficient documentation

## 2017-04-10 NOTE — Progress Notes (Signed)
Medical Nutrition Therapy: Visit start time: 1330  end time: 1430  Assessment:  Diagnosis: weight gain Past medical history: hypothyroidism Psychosocial issues/ stress concerns: none Preferred learning method:  . No preference indicated  Current weight: 170.1lbs  Height: 5'9" Medications, supplements: reconciled list in medical record  Progress and evaluation: Patient reports weight gain of about 7lbs in the past 6 months, despite efforts to actually lose weight. She is concerned that further weight gain could lead to other health issues such as diabetes, as her mother had diabetes, and her sister has pre-diabetes. Her husband cooks all meals, often prepares her plate. Husband is 6'5" and not overweight, enjoys larger food portions and desserts.    Physical activity: hot yoga 1hr 2 times a week  Dietary Intake:  Usual eating pattern includes 3 meals and 2-3 snacks per day. Dining out frequency: 1 meals per week.  Breakfast: early am:2c coffee with creamer and half and half to minimize sugar; 10am hard boiled egg 1-2; fruit and toast Snack: fruit Lunch: 2-3pm leftovers; 7/16 salad, 2 ears corn; [place] burger Snack: sometimes fruit; sometimes pretzels or chips prior to supper.Often craves sweets about 4pm Supper: 5:30-7pm meat, 1 starch-- potato such as homemade fries, rice, pasta, vegetabes; often brownies or cookies as dessert.  Snack: 90-cal smirnoff beer (was drinking 2 glasses wine) Beverages: water all day at work, no sodas, some tea with sugar, less than 1 tsp per glass.   Nutrition Care Education: Topics covered: weight control, diabetes prevention Basic nutrition: basic food groups, appropriate nutrient balance, appropriate meal and snack schedule, general nutrition guidelines    Weight control: benefits of weight control, macronutrient balance, estimated energy needs at 1300kcal and provided guidance for food portions; discussed effects of endocrine issues on weight, effects of  circulating insulin levels; importance of balancing energy intake throughout the day; role of exercise and cardiovascular exercise on weight control.   Nutritional Diagnosis:  [place]-3.4 Unintentional weight gain As related to hypothyroidism, excess calories.  As evidenced by patient report.  Intervention: Instruction as noted above.   Commended patient for healthy food choices she is currently making.   Set goals with direction from patient.    She declined follow-up at this time, she will schedule later if needed.  Education Materials given:  . Food lists/ Planning A Balanced Meal . Goals/ instructions  Learner/ who was taught:  . Patient   Level of understanding: Marland Kitchen. Verbalizes/ demonstrates competency  Demonstrated degree of understanding via:   Teach back Learning barriers: . None  Willingness to learn/ readiness for change: . Eager, change in progress   Monitoring and Evaluation:  Dietary intake, exercise, and body weight      follow up: prn

## 2017-04-10 NOTE — Patient Instructions (Signed)
   Control portions of starchy foods especially at suppertime.   Work to include 2 or more at each mealtime -- starch and protein or fruit and protein or veggies and protein.   Plan for desserts by having less starch during the meal; eating a small dessert portion, and/or having a dessert less often.   Increase "free" vegetables with lunch meal.

## 2017-06-07 ENCOUNTER — Encounter: Payer: Self-pay | Admitting: Internal Medicine

## 2017-06-08 NOTE — Telephone Encounter (Signed)
Jodi Stewart was referred to [facility] for help with weight loss.  See her previous my chart message.  I was not aware of any discussion about coding.  Is Jodi Stewart questioning the bill from [facility]?  Jodi Stewart may be able to help sort this out.  Jodi Stewart may have to contact [facility] if the question is about the billing that [facility] used.  Can see if Jodi Stewart has any other suggestions.

## 2017-06-08 NOTE — Telephone Encounter (Signed)
See other my chart message.  Can see if Erie NoeVanessa can help.

## 2017-06-09 NOTE — Telephone Encounter (Signed)
Thank you.    Dr Giannamarie Paulus 

## 2017-06-12 ENCOUNTER — Encounter: Payer: Self-pay | Admitting: Internal Medicine

## 2017-06-12 NOTE — Telephone Encounter (Signed)
I am ok to change diagnosis code to overweight, but I do not know how or who to give this information to.  Her BMI was >25.  Please forward this information to where it needs to go.  Please also let pt know I have ok'd.

## 2017-06-13 ENCOUNTER — Other Ambulatory Visit: Payer: Self-pay | Admitting: Internal Medicine

## 2017-06-19 NOTE — Telephone Encounter (Signed)
I received a call today regarding the letter we sent, if you want to change the code for her referral I will need a addendum to a Office visit note with that code on it and a new letter requesting that you made a mistake in coding and are asking it to be changed permanently, please see me to explain more.

## 2017-07-01 ENCOUNTER — Encounter: Payer: Self-pay | Admitting: Internal Medicine

## 2017-07-03 NOTE — Telephone Encounter (Signed)
I am not in the office today.  Given she fractured her wrist, I agree with evaluation by ortho today.  The fastest way for her to be seen would probably be to go to [facility]'s walk in clinic.  Olegario Messier and Melissa have the information for this.  She can walk in is my understanding.  Please confirm is able to do this and able to be seen today.

## 2017-07-18 NOTE — H&P (Signed)
Jodi MiniumLucy Campbell Stewart is an 61 y.o. female.   Chief Complaint: LEFT WRIST INJURY  HPI: THE PATIENT IS A 61 Y/O FEMALE WHO INJURED THE LEFT WRIST ON 07/01/17 DUE TO A FALL ON THE HAND. SHE WAS SEEN AT AN URGENT CARE AND PLACED IN A SPLINT.  SHE FOLLOWED UP IN OUR OFFICE FOR FURTHER EVALUATION. THE FRACTURE WAS TREATED WITH IMMOBILIZATION OF THE WRIST IN A SHORT ARM CAST. SHE WAS SEEN 2 WEEKS LATER WITH RADIOGRAPHS SHOWING FURTHER DISPLACEMENT OF THE DISTAL RADIUS.  DISCUSSED THE REASON AND RATIONALE FOR SURGICAL INTERVENTION AND THE USE OF A PLATE AND SCREWS TO REALIGN THE BONE.  DISCUSSED THE SURGICAL PROCEDURE, INCLUDING THE RISKS VERSUS BENEFITS, AND THE POST-OPERATIVE RECOVERY.  THE PATIENT IS HERE TODAY FOR SURGERY.   Past Medical History:  Diagnosis Date  . Fibrocystic breast    pt is BRACA neg   . Graves disease   . Lichen planus     Past Surgical History:  Procedure Laterality Date  . ABDOMINAL HYSTERECTOMY  2004  . BREAST BIOPSY Left 2002   needle bx  . COLONOSCOPY WITH PROPOFOL N/A 12/28/2015   Procedure: COLONOSCOPY WITH PROPOFOL;  Surgeon: Wallace CullensPaul Y Oh, MD;  Location[facility];  Service: Gastroenterology;  Laterality: N/A;    Family History  Problem Relation Age of Onset  . Goiter Mother   . Mental illness Mother   . Ovarian cancer Mother        died [id number]  . Breast cancer Maternal Aunt 60  . Breast cancer Maternal Grandmother 80  . Goiter Sister   . Breast cancer Sister [id number]  . Cancer Maternal Aunt [phone]       Renal Cancer  . Colon cancer Neg Hx    Social History:  reports that she quit smoking about 31 years ago. Her smoking use included Cigarettes. She has never used smokeless tobacco. She reports that she drinks alcohol. She reports that she does not use drugs.  Allergies:  Allergies  Allergen Reactions  . Aspirin   . Monistat [Miconazole]   . Penicillins     No prescriptions prior to admission.    No results found for this or any previous visit (from  the past 48 hour(s)). No results found.  ROS NO RECENT ILLNESSES OR HOSPITALIZATIONS  Last menstrual period 09/18/2003. Physical Exam  General Appearance:  Alert, cooperative, no distress, appears stated age  Head:  Normocephalic, without obvious abnormality, atraumatic  Eyes:  Pupils equal, conjunctiva/corneas clear,         Throat: Lips, mucosa, and tongue normal; teeth and gums normal  Neck: No visible masses     Lungs:   respirations unlabored  Chest Wall:  No tenderness or deformity  Heart:  Regular rate and rhythm,  Abdomen:   Soft, non-tender,         Extremities:   Pulses: 2+ and symmetric  Skin: Skin color, texture, turgor normal, no rashes or lesions     Neurologic: Normal    Assessment LEFT DISTAL RADIUS FRACTURE/DISPLACED INTRAARTICULAR   Plan LEFT DISTAL RADIUS OPEN REDUCTION WITH INTERNAL FIXATION  R/B/A DISCUSSED WITH PT IN OFFICE.  PT VOICED UNDERSTANDING OF PLAN CONSENT SIGNED DAY OF SURGERY PT SEEN AND EXAMINED PRIOR TO OPERATIVE PROCEDURE/DAY OF SURGERY SITE MARKED. QUESTIONS ANSWERED WILL GO HOME FOLLOWING SURGERY  WE ARE PLANNING SURGERY FOR YOUR UPPER EXTREMITY. THE RISKS AND BENEFITS OF SURGERY INCLUDE BUT NOT LIMITED TO BLEEDING INFECTION, DAMAGE TO NEARBY NERVES ARTERIES TENDONS, FAILURE OF SURGERY  TO ACCOMPLISH ITS INTENDED GOALS, PERSISTENT SYMPTOMS AND NEED FOR FURTHER SURGICAL INTERVENTION. WITH THIS IN MIND WE WILL PROCEED. I HAVE DISCUSSED WITH THE PATIENT THE PRE AND POSTOPERATIVE REGIMEN AND THE DOS AND DON'TS. PT VOICED UNDERSTANDING AND INFORMED CONSENT SIGNED.   Jodi Stewart 07/18/2017, 12:08 PM

## 2017-07-19 NOTE — Pre-Procedure Instructions (Signed)
Peter MiniumLucy Campbell Lubinski  07/19/2017      [id number] [facility]- [place], [place] - [address] [place] [address] Phone: [phone] Fax: [phone]  [facility] [phone]#[id number] [facility]- [place] - [address] [place] [address] Phone: [phone] Fax: [phone]    Your procedure is scheduled on July 22, 2017.  [facility] at 730 AM.  Call this number if you have problems the morning of surgery:  [phone]   Remember:  Do not eat food or drink liquids after midnight.  Take these medicines the morning of surgery with A SIP OF WATER  Levothyroxine (synthroid).  7 days prior to surgery STOP taking any Aspirin (unless otherwise instructed by your surgeon), Aleve, Naproxen, Ibuprofen, Motrin, Advil, Goody's, BC's, all herbal medications, fish oil, and all vitamins  Continue all other medications as instructed by your physician except follow the above medication instructions before surgery   Do not wear jewelry, make-up or nail polish.  Do not wear lotions, powders, or perfumes, or deoderant.  Do not shave 48 hours prior to surgery.   Do not bring valuables to the hospital.  [facility] is not responsible for any belongings or valuables.  Contacts, dentures or bridgework may not be worn into surgery.  Leave your suitcase in the car.  After surgery it may be brought to your room.  For patients admitted to the hospital, discharge time will be determined by your treatment team.  Patients discharged the day of surgery will not be allowed to drive home.   Special instructions:   [facility]- Preparing For Surgery  Before surgery, you can play an important role. Because skin is not sterile, your skin needs to be as free of germs as possible. You can reduce the number of germs on your skin by washing with CHG (chlorahexidine gluconate) Soap before surgery.  CHG is an antiseptic cleaner  which kills germs and bonds with the skin to continue killing germs even after washing.  Please do not use if you have an allergy to CHG or antibacterial soaps. If your skin becomes reddened/irritated stop using the CHG.  Do not shave (including legs and underarms) for at least 48 hours prior to first CHG shower. It is OK to shave your face.  Please follow these instructions carefully.   1. Shower the NIGHT BEFORE SURGERY and the MORNING OF SURGERY with CHG.   2. If you chose to wash your hair, wash your hair first as usual with your normal shampoo.  3. After you shampoo, rinse your hair and body thoroughly to remove the shampoo.  4. Use CHG as you would any other liquid soap. You can apply CHG directly to the skin and wash gently with a scrungie or a clean washcloth.   5. Apply the CHG Soap to your body ONLY FROM THE NECK DOWN.  Do not use on open wounds or open sores. Avoid contact with your eyes, ears, mouth and genitals (private parts). Wash Face and genitals (private parts)  with your normal soap.  6. Wash thoroughly, paying special attention to the area where your surgery will be performed.  7. Thoroughly rinse your body with warm water from the neck down.  8. DO NOT shower/wash with your normal soap after using and rinsing off the CHG Soap.  9. Pat yourself dry with a CLEAN TOWEL.  10. Wear CLEAN PAJAMAS to bed the night  before surgery, wear comfortable clothes the morning of surgery  11. Place CLEAN SHEETS on your bed the night of your first shower and DO NOT SLEEP WITH PETS.    Day of Surgery: Do not apply any deodorants/lotions. Please wear clean clothes to the hospital/surgery center.     Please read over the following fact sheets that you were given. Pain Booklet, Coughing and Deep Breathing and Surgical Site Infection Prevention

## 2017-07-20 ENCOUNTER — Encounter (HOSPITAL_COMMUNITY)
Admission: RE | Admit: 2017-07-20 | Discharge: 2017-07-20 | Disposition: A | Discharge status: Home / Self Care | Payer: 59 | Source: Ambulatory Visit | Attending: Orthopedic Surgery | Admitting: Orthopedic Surgery

## 2017-07-20 ENCOUNTER — Encounter (HOSPITAL_COMMUNITY): Payer: Self-pay

## 2017-07-20 DIAGNOSIS — Z01818 Encounter for other preprocedural examination: Secondary | ICD-10-CM | POA: Diagnosis not present

## 2017-07-20 DIAGNOSIS — S52502A Unspecified fracture of the lower end of left radius, initial encounter for closed fracture: Secondary | ICD-10-CM | POA: Diagnosis not present

## 2017-07-20 DIAGNOSIS — X58XXXA Exposure to other specified factors, initial encounter: Secondary | ICD-10-CM | POA: Insufficient documentation

## 2017-07-20 HISTORY — DX: Laceration without foreign body of unspecified part of head, initial encounter: S01.91XA

## 2017-07-20 HISTORY — DX: Hypothyroidism, unspecified: E03.9

## 2017-07-20 LAB — BASIC METABOLIC PANEL
ANION GAP: 8 (ref 5–15)
BUN: 10 mg/dL (ref 6–20)
CALCIUM: 9.5 mg/dL (ref 8.9–10.3)
CO2: 27 mmol/L (ref 22–32)
Chloride: 103 mmol/L (ref 101–111)
Creatinine, Ser: 0.73 mg/dL (ref 0.44–1.00)
GFR calc Af Amer: >60 mL/min (ref >60)
GFR calc non Af Amer: >60 mL/min (ref >60)
Glucose, Bld: 116 mg/dL — ABNORMAL HIGH (ref 65–99)
POTASSIUM: 4.1 mmol/L (ref 3.5–5.1)
SODIUM: 138 mmol/L (ref 135–145)

## 2017-07-20 LAB — CBC
HCT: 40.9 % (ref 36.0–46.0)
Hemoglobin: 13.2 g/dL (ref 12.0–15.0)
MCH: 28.9 pg (ref 26.0–34.0)
MCHC: 32.3 g/dL (ref 30.0–36.0)
MCV: 89.7 fL (ref 78.0–100.0)
PLATELETS: 245 10*3/uL (ref 150–400)
RBC: 4.56 MIL/uL (ref 3.87–5.11)
RDW: 13.3 % (ref 11.5–15.5)
WBC: 7 10*3/uL (ref 4.0–10.5)

## 2017-07-20 NOTE — Progress Notes (Signed)
Pt. Reports three falls in the past yr. Although they were as a result of "slips", last one being a  slip on a wet surface. Pt. Reports the two earlier falls were a result of domestic activities & a sports  (raquet ball)event. Both required sutures to head lacerations.  Pt. Does power  Hot yoga, on a regular basis up until this recent fall. Pt. Denies cardiac or pulmonary concerns. Pt. Followed by Dr. Verna Czech. [facility] at [facility].

## 2017-07-22 ENCOUNTER — Encounter (HOSPITAL_COMMUNITY)
Admission: RE | Disposition: A | Discharge status: Home / Self Care | Payer: Self-pay | Source: Ambulatory Visit | Attending: Orthopedic Surgery

## 2017-07-22 ENCOUNTER — Ambulatory Visit (HOSPITAL_COMMUNITY): Payer: 59 | Admitting: Certified Registered Nurse Anesthetist

## 2017-07-22 ENCOUNTER — Ambulatory Visit (HOSPITAL_COMMUNITY)
Admission: RE | Admit: 2017-07-22 | Discharge: 2017-07-22 | Disposition: A | Discharge status: Home / Self Care | Payer: 59 | Source: Ambulatory Visit | Attending: Orthopedic Surgery | Admitting: Orthopedic Surgery

## 2017-07-22 ENCOUNTER — Encounter (HOSPITAL_COMMUNITY): Payer: Self-pay | Admitting: *Deleted

## 2017-07-22 DIAGNOSIS — Z880 Allergy status to penicillin: Secondary | ICD-10-CM | POA: Diagnosis not present

## 2017-07-22 DIAGNOSIS — Z0000 Encounter for general adult medical examination without abnormal findings: Secondary | ICD-10-CM

## 2017-07-22 DIAGNOSIS — Z87891 Personal history of nicotine dependence: Secondary | ICD-10-CM | POA: Diagnosis not present

## 2017-07-22 DIAGNOSIS — W19XXXA Unspecified fall, initial encounter: Secondary | ICD-10-CM | POA: Diagnosis not present

## 2017-07-22 DIAGNOSIS — S52572A Other intraarticular fracture of lower end of left radius, initial encounter for closed fracture: Secondary | ICD-10-CM | POA: Insufficient documentation

## 2017-07-22 HISTORY — PX: OPEN REDUCTION INTERNAL FIXATION (ORIF) DISTAL RADIAL FRACTURE: SHX5989

## 2017-07-22 SURGERY — OPEN REDUCTION INTERNAL FIXATION (ORIF) DISTAL RADIUS FRACTURE
Anesthesia: Regional | Site: Wrist | Laterality: Left

## 2017-07-22 MED ORDER — PHENYLEPHRINE HCL 10 MG/ML IJ SOLN
INTRAVENOUS | Status: DC | PRN
  Administered 2017-07-22: 25 ug/min via INTRAVENOUS

## 2017-07-22 MED ORDER — FENTANYL CITRATE (PF) 250 MCG/5ML IJ SOLN
INTRAMUSCULAR | Status: AC
  Filled 2017-07-22: qty 5

## 2017-07-22 MED ORDER — CLINDAMYCIN PHOSPHATE 900 MG/50ML IV SOLN
900.0000 mg | INTRAVENOUS | Status: AC
  Administered 2017-07-22: 900 mg via INTRAVENOUS

## 2017-07-22 MED ORDER — PROPOFOL 10 MG/ML IV BOLUS
INTRAVENOUS | Status: DC | PRN
  Administered 2017-07-22: 160 mg via INTRAVENOUS

## 2017-07-22 MED ORDER — MIDAZOLAM HCL 2 MG/2ML IJ SOLN
INTRAMUSCULAR | Status: AC
  Filled 2017-07-22: qty 2

## 2017-07-22 MED ORDER — ONDANSETRON HCL 4 MG/2ML IJ SOLN
INTRAMUSCULAR | Status: DC | PRN
  Administered 2017-07-22: 4 mg via INTRAVENOUS

## 2017-07-22 MED ORDER — PROPOFOL 10 MG/ML IV BOLUS
INTRAVENOUS | Status: AC
  Filled 2017-07-22: qty 20

## 2017-07-22 MED ORDER — LIDOCAINE HCL (CARDIAC) 20 MG/ML IV SOLN
INTRAVENOUS | Status: DC | PRN
  Administered 2017-07-22: 40 mg via INTRAVENOUS

## 2017-07-22 MED ORDER — LACTATED RINGERS IV SOLN
INTRAVENOUS | Status: DC
  Administered 2017-07-22 (×3): via INTRAVENOUS

## 2017-07-22 MED ORDER — CHLORHEXIDINE GLUCONATE 4 % EX LIQD: 60.0000 mL | Freq: Once | CUTANEOUS | Status: DC

## 2017-07-22 MED ORDER — DEXAMETHASONE SODIUM PHOSPHATE 4 MG/ML IJ SOLN
INTRAMUSCULAR | Status: DC | PRN
  Administered 2017-07-22: 10 mg via INTRAVENOUS

## 2017-07-22 MED ORDER — CLINDAMYCIN PHOSPHATE 900 MG/50ML IV SOLN
INTRAVENOUS | Status: AC
  Filled 2017-07-22: qty 50

## 2017-07-22 MED ORDER — FENTANYL CITRATE (PF) 100 MCG/2ML IJ SOLN
INTRAMUSCULAR | Status: DC | PRN
  Administered 2017-07-22: 50 ug via INTRAVENOUS
  Administered 2017-07-22 (×2): 25 ug via INTRAVENOUS

## 2017-07-22 MED ORDER — MIDAZOLAM HCL 5 MG/5ML IJ SOLN
INTRAMUSCULAR | Status: DC | PRN
  Administered 2017-07-22: 2 mg via INTRAVENOUS

## 2017-07-22 MED ORDER — 0.9 % SODIUM CHLORIDE (POUR BTL) OPTIME
TOPICAL | Status: DC | PRN
  Administered 2017-07-22: 1000 mL

## 2017-07-22 MED ORDER — PHENYLEPHRINE HCL 10 MG/ML IJ SOLN
INTRAMUSCULAR | Status: DC | PRN
  Administered 2017-07-22 (×2): 80 ug via INTRAVENOUS
  Administered 2017-07-22: 40 ug via INTRAVENOUS

## 2017-07-22 SURGICAL SUPPLY — 63 items
BANDAGE ACE 3X5.8 VEL STRL LF (GAUZE/BANDAGES/DRESSINGS) ×2 IMPLANT
BANDAGE ACE 4X5 VEL STRL LF (GAUZE/BANDAGES/DRESSINGS) ×2 IMPLANT
BANDAGE ELASTIC 4 VELCRO ST LF (GAUZE/BANDAGES/DRESSINGS) ×2 IMPLANT
BIT DRILL 2.2 SS TIBIAL (BIT) ×2 IMPLANT
BLADE CLIPPER SURG (BLADE) IMPLANT
BNDG ESMARK 4X9 LF (GAUZE/BANDAGES/DRESSINGS) ×2 IMPLANT
BNDG GAUZE ELAST 4 BULKY (GAUZE/BANDAGES/DRESSINGS) ×2 IMPLANT
CANISTER SUCT 3000ML PPV (MISCELLANEOUS) ×2 IMPLANT
CORDS BIPOLAR (ELECTRODE) ×2 IMPLANT
COVER SURGICAL LIGHT HANDLE (MISCELLANEOUS) ×2 IMPLANT
CUFF TOURNIQUET SINGLE 18IN (TOURNIQUET CUFF) ×2 IMPLANT
CUFF TOURNIQUET SINGLE 24IN (TOURNIQUET CUFF) IMPLANT
DECANTER SPIKE VIAL GLASS SM (MISCELLANEOUS) ×2 IMPLANT
DRAPE OEC MINIVIEW 54X84 (DRAPES) ×2 IMPLANT
DRAPE SURG 17X11 SM STRL (DRAPES) ×2 IMPLANT
DRSG ADAPTIC 3X8 NADH LF (GAUZE/BANDAGES/DRESSINGS) ×2 IMPLANT
GAUZE SPONGE 4X4 12PLY STRL (GAUZE/BANDAGES/DRESSINGS) ×2 IMPLANT
GAUZE SPONGE 4X4 16PLY XRAY LF (GAUZE/BANDAGES/DRESSINGS) ×2 IMPLANT
GAUZE XEROFORM 5X9 LF (GAUZE/BANDAGES/DRESSINGS) ×2 IMPLANT
GLOVE BIOGEL PI IND STRL 8.5 (GLOVE) ×1 IMPLANT
GLOVE BIOGEL PI INDICATOR 8.5 (GLOVE) ×1
GLOVE SURG ORTHO 8.0 STRL STRW (GLOVE) ×2 IMPLANT
GOWN STRL REUS W/ TWL LRG LVL3 (GOWN DISPOSABLE) ×1 IMPLANT
GOWN STRL REUS W/ TWL XL LVL3 (GOWN DISPOSABLE) ×1 IMPLANT
GOWN STRL REUS W/TWL LRG LVL3 (GOWN DISPOSABLE) ×1
GOWN STRL REUS W/TWL XL LVL3 (GOWN DISPOSABLE) ×1
K-WIRE 1.6 (WIRE) ×1
K-WIRE FX5X1.6XNS BN SS (WIRE) ×1
KIT BASIN OR (CUSTOM PROCEDURE TRAY) ×2 IMPLANT
KIT ROOM TURNOVER OR (KITS) ×2 IMPLANT
KWIRE FX5X1.6XNS BN SS (WIRE) ×1 IMPLANT
NEEDLE HYPO 25X1 1.5 SAFETY (NEEDLE) ×2 IMPLANT
NS IRRIG 1000ML POUR BTL (IV SOLUTION) ×2 IMPLANT
PACK ORTHO EXTREMITY (CUSTOM PROCEDURE TRAY) ×2 IMPLANT
PAD ARMBOARD 7.5X6 YLW CONV (MISCELLANEOUS) ×4 IMPLANT
PAD CAST 4YDX4 CTTN HI CHSV (CAST SUPPLIES) ×1 IMPLANT
PADDING CAST COTTON 4X4 STRL (CAST SUPPLIES) ×1
PEG LOCKING SMOOTH 2.2X20 (Screw) ×4 IMPLANT
PEG LOCKING SMOOTH 2.2X22 (Screw) ×2 IMPLANT
PEG LOCKING SMOOTH 2.2X24 (Peg) ×2 IMPLANT
PLATE STANDARD DVR LEFT (Plate) ×2 IMPLANT
PLATE STD DVR LT 24X51 (Plate) ×1 IMPLANT
PUTTY DBX 2.5CC (Putty) ×2 IMPLANT
PUTTY DBX 2.5CC DEPUY (Putty) ×1 IMPLANT
SCREW LOCK 16X2.7X 3 LD TPR (Screw) ×4 IMPLANT
SCREW LOCK 18X2.7X 3 LD TPR (Screw) ×1 IMPLANT
SCREW LOCK 24X2.7X3 LD THRD (Screw) ×3 IMPLANT
SCREW LOCKING 2.7X16 (Screw) ×4 IMPLANT
SCREW LOCKING 2.7X18 (Screw) ×1 IMPLANT
SCREW LOCKING 2.7X24MM (Screw) ×3 IMPLANT
SCREW NONLOCK 2.7X28MM (Screw) ×2 IMPLANT
SOAP 2 % CHG 4 OZ (WOUND CARE) ×2 IMPLANT
SPONGE LAP 4X18 X RAY DECT (DISPOSABLE) ×2 IMPLANT
SUT PROLENE 4 0 PS 2 18 (SUTURE) ×2 IMPLANT
SUT VIC AB 2-0 CT1 27 (SUTURE) ×1
SUT VIC AB 2-0 CT1 TAPERPNT 27 (SUTURE) ×1 IMPLANT
SUT VICRYL 4-0 PS2 18IN ABS (SUTURE) ×2 IMPLANT
SYR CONTROL 10ML LL (SYRINGE) IMPLANT
TOWEL OR 17X24 6PK STRL BLUE (TOWEL DISPOSABLE) ×2 IMPLANT
TOWEL OR 17X26 10 PK STRL BLUE (TOWEL DISPOSABLE) ×2 IMPLANT
TUBE CONNECTING 12X1/4 (SUCTIONS) ×2 IMPLANT
WATER STERILE IRR 1000ML POUR (IV SOLUTION) ×2 IMPLANT
YANKAUER SUCT BULB TIP NO VENT (SUCTIONS) IMPLANT

## 2017-07-22 NOTE — Anesthesia Procedure Notes (Addendum)
Anesthesia Regional Block: Cervical plexus block   Pre-Anesthetic Checklist: ,, timeout performed, Correct Patient, Correct Site, Correct Laterality, Correct Procedure, Correct Position, site marked, Risks and benefits discussed,  Surgical consent,  Pre-op evaluation,  At surgeon's request and post-op pain management  Laterality: Left  Prep: chloraprep       Needles:  Injection technique: Single-shot  Needle Type: Echogenic Stimulator Needle     Needle Length: 9cm  Needle Gauge: 22     Additional Needles:   Narrative:  Start time: 07/22/2017 8:50 AM End time: 07/22/2017 8:55 AM Injection made incrementally with aspirations every 5 mL.  Performed by: Personally   Additional Notes: 30 cc 0.5% Bupivacaine with 1:200 epi injected easily

## 2017-07-22 NOTE — Anesthesia Procedure Notes (Signed)
Procedure Name: LMA Insertion Date/Time: 07/22/2017 9:26 AM Performed by: Dairl PonderJIANG, Kyley Laurel Pre-anesthesia Checklist: Patient identified, Emergency Drugs available, Suction available, Patient being monitored and Timeout performed Patient Re-evaluated:Patient Re-evaluated prior to induction Oxygen Delivery Method: Circle system utilized Preoxygenation: Pre-oxygenation with 100% oxygen Induction Type: IV induction Ventilation: Mask ventilation without difficulty LMA: LMA inserted LMA Size: 4.0 Number of attempts: 1 Placement Confirmation: positive ETCO2 and breath sounds checked- equal and bilateral Tube secured with: Tape Dental Injury: Teeth and Oropharynx as per pre-operative assessment

## 2017-07-22 NOTE — Anesthesia Preprocedure Evaluation (Addendum)
Anesthesia Evaluation  Patient identified by MRN, date of birth, ID band Patient awake    Reviewed: Allergy & Precautions, NPO status , Patient's Chart, lab work & pertinent test results  Airway Mallampati: II  TM Distance: >3 FB Neck ROM: Full    Dental  (+) Teeth Intact, Dental Advisory Given   Pulmonary former smoker,    breath sounds clear to auscultation       Cardiovascular  Rhythm:Regular Rate:Normal     Neuro/Psych    GI/Hepatic   Endo/Other    Renal/GU      Musculoskeletal   Abdominal   Peds  Hematology   Anesthesia Other Findings   Reproductive/Obstetrics                            Anesthesia Physical Anesthesia Plan  ASA: II  Anesthesia Plan: MAC and Regional   Post-op Pain Management:    Induction: Intravenous  PONV Risk Score and Plan: Ondansetron and Dexamethasone  Airway Management Planned: Natural Airway and Simple Face Mask  Additional Equipment:   Intra-op Plan:   Post-operative Plan:   Informed Consent: I have reviewed the patients History and Physical, chart, labs and discussed the procedure including the risks, benefits and alternatives for the proposed anesthesia with the patient or authorized representative who has indicated his/her understanding and acceptance.   Dental advisory given  Plan Discussed with: CRNA and Anesthesiologist  Anesthesia Plan Comments:         Anesthesia Quick Evaluation

## 2017-07-22 NOTE — Op Note (Signed)
PREOPERATIVE DIAGNOSIS: Left wrist intra-articular distal radius fracture of 3 or more fragments  POSTOPERATIVE DIAGNOSIS: Same  ATTENDING SURGEON: Dr. Melvyn Novas is scrubbed and present for the entire procedure  ASSISTANT SURGEON: None  ANESTHESIA: Gen. via LMA was supraclavicular block  OPERATIVE PROCEDURE: #1: Open reduction internal fixation of displaced intra-articular distal radius fracture 3 or more fragments #2: Left wrist brachia radialis tendon release, tendon tenotomy #3: Radiographs 3 views left wrist  IMPLANTS: Standard DVR [employer]omet cross lock, 2 cc DBX Synthes/Depuy   RADIOGRAPHIC INTERPRETATION: AP lateral oblique views of the wrist to show the volar plate fixation in place in good alignment with good restoration of the radial height inclination and tilt  SURGICAL INDICATIONS: Jodi Stewart is a right-hand-dominant female who sustained a closed intra-articular distal radius fracture. Patient was seen and evaluated in the office and recommended undergo the above procedure. Risks benefits and alternatives were discussed in detail with the patient in a signed informed consent was obtained. Risks include but not limited to bleeding infection damage to nearby nerves arteries or tendons nonunion malunion hardware failure loss of motion of the wrists and digits and need for further surgical intervention.  SURGICAL TECHNIQUE: Patient was properly identified in the preoperative holding area and a mark with a permanent marker made on the left wrist indicate the correct operative site. The patient was then brought back to the operating room placed supine on anesthesia and table where general anesthesia was administered. Patient tolerated this well. A well-padded tourniquet was then placed on the left brachium and sealed with the appropriate drape. Left upper extremity was then prepped and draped in normal sterile fashion. A timeout was called the correct site was identified and the  procedure was then begun. Attention was then turned to the left wrist. The limited bit elevated the tourniquet insufflated. Preoperative antibiotics were given prior to any skin incision. A longitudinal incision made directly over the FCR sheath. Dissection carried down through the skin subcutaneous tissue. Going through the floor the FCR sheath the FPL was swept out of the way. An L-shaped pronator quadratus flap was then elevated and the fracture site was then exposed. The brachia radialis was then carefully released off the radial styloid. Tendon tenotomy and release was done to allow for reduction of the radial column. This was an intra-articular fracture 3 or more fragments. Fracture hematoma was then evacuated open reduction was then performed. The volar plate was then applied and held distally with a K wire. Given the metaphyseal shortening and comminution several cc of DBX was packed into the defect. The position was then confirmed using the C-arm. Plate position was then readjusted with the oblong screw hole proximally. After confirmation of the plate position and reduction distal fixation was then carried out from an ulnar to radial direction with a combination distal locking pegs and screws. Final shaft fixation was carried out combination of locking screws. The wound was thoroughly irrigated. The pronator quadratus was then closed with 2-0 Vicryl suture. The subcutaneous tissues closed with 4-0 Vicryl. Skin was then closed with simple 4-0 Prolene suture. Adaptic dressing and a sterile compressive bandage then applied. The patient was then placed in well-padded sugar tong splint and extubated taken recovery room in good condition.  POSTOPERATIVE PLAN: Patient to be discharged to home seen back in the office in 2 weeks for wound check suture removal x-rays application of a short arm cast place the therapy order for the four-week mark see her back at the four-week mark  for radiographs cast off and begin  a postoperative ORIF protocol.

## 2017-07-22 NOTE — Discharge Instructions (Signed)
KEEP BANDAGE CLEAN AND DRY °CALL OFFICE FOR F/U APPT [phone] in 14 days °DR Ramatoulaye Pack [phone] °KEEP HAND ELEVATED ABOVE HEART °OK TO APPLY ICE TO OPERATIVE AREA °CONTACT OFFICE IF ANY WORSENING PAIN OR CONCERNS. °

## 2017-07-22 NOTE — Anesthesia Postprocedure Evaluation (Signed)
Anesthesia Post Note  Patient: Peter MiniumLucy Campbell Barfoot  Procedure(s) Performed: LEFT OPEN REDUCTION INTERNAL FIXATION (ORIF) DISTAL RADIAL FRACTURE AND REPAIR AS INDICATED (Left Wrist)     Patient location during evaluation: PACU Anesthesia Type: Regional Pain management: pain level controlled Vital Signs Assessment: post-procedure vital signs reviewed and stable Respiratory status: spontaneous breathing, nonlabored ventilation and respiratory function stable Cardiovascular status: blood pressure returned to baseline Anesthetic complications: no    Last Vitals:  Vitals:   07/22/17 1111 07/22/17 1126  BP: (!) 103/55 (!) 106/59  Pulse: 60 63  Resp: 14 18  Temp:  36.4 C  SpO2: 100% 100%    Last Pain:  Vitals:   07/22/17 1126  TempSrc:   PainSc: 0-No pain                 Amani Marseille COKER

## 2017-07-22 NOTE — Transfer of Care (Signed)
Immediate Anesthesia Transfer of Care Note  Patient: Jodi MiniumLucy Campbell Malek  Procedure(s) Performed: LEFT OPEN REDUCTION INTERNAL FIXATION (ORIF) DISTAL RADIAL FRACTURE AND REPAIR AS INDICATED (Left Wrist)  Patient Location: PACU  Anesthesia Type:General and Regional  Level of Consciousness: awake, alert  and oriented  Airway & Oxygen Therapy: Patient Spontanous Breathing  Post-op Assessment: Report given to RN and Post -op Vital signs reviewed and stable  Post vital signs: Reviewed and stable  Last Vitals:  Vitals:   07/22/17 0809 07/22/17 1056  BP: 132/72   Pulse: 63 68  Resp: 18 14  Temp: 37 C   SpO2: 99% 100%    Last Pain:  Vitals:   07/22/17 0809  TempSrc: Oral         Complications: No apparent anesthesia complications

## 2017-07-23 NOTE — OR Nursing (Signed)
Documenting implant information provided by [employer] sales ref Fae PippinJosh Hall to capture charges

## 2017-07-24 ENCOUNTER — Encounter (HOSPITAL_COMMUNITY): Payer: Self-pay | Admitting: Orthopedic Surgery

## 2017-08-14 ENCOUNTER — Other Ambulatory Visit: Payer: Self-pay | Admitting: Internal Medicine

## 2017-10-19 ENCOUNTER — Encounter: Payer: Self-pay | Admitting: Internal Medicine

## 2017-10-19 ENCOUNTER — Ambulatory Visit (INDEPENDENT_AMBULATORY_CARE_PROVIDER_SITE_OTHER): Payer: 59 | Admitting: Internal Medicine

## 2017-10-19 VITALS — BP 134/76 | HR 64 | Temp 98.6°F | Ht 69.0 in | Wt 170.8 lb

## 2017-10-19 DIAGNOSIS — E0789 Other specified disorders of thyroid: Secondary | ICD-10-CM

## 2017-10-19 DIAGNOSIS — R739 Hyperglycemia, unspecified: Secondary | ICD-10-CM | POA: Diagnosis not present

## 2017-10-19 DIAGNOSIS — Z8041 Family history of malignant neoplasm of ovary: Secondary | ICD-10-CM | POA: Diagnosis not present

## 2017-10-19 DIAGNOSIS — E2839 Other primary ovarian failure: Secondary | ICD-10-CM | POA: Diagnosis not present

## 2017-10-19 DIAGNOSIS — R413 Other amnesia: Secondary | ICD-10-CM | POA: Diagnosis not present

## 2017-10-19 DIAGNOSIS — Z1231 Encounter for screening mammogram for malignant neoplasm of breast: Secondary | ICD-10-CM | POA: Diagnosis not present

## 2017-10-19 DIAGNOSIS — Z8781 Personal history of (healed) traumatic fracture: Secondary | ICD-10-CM | POA: Diagnosis not present

## 2017-10-19 DIAGNOSIS — E0500 Thyrotoxicosis with diffuse goiter without thyrotoxic crisis or storm: Secondary | ICD-10-CM

## 2017-10-19 DIAGNOSIS — Z0000 Encounter for general adult medical examination without abnormal findings: Secondary | ICD-10-CM | POA: Diagnosis not present

## 2017-10-19 DIAGNOSIS — Z1239 Encounter for other screening for malignant neoplasm of breast: Secondary | ICD-10-CM

## 2017-10-19 LAB — TSH: TSH: 1.97 u[IU]/mL (ref 0.35–4.50)

## 2017-10-19 LAB — COMPREHENSIVE METABOLIC PANEL
ALT: 14 U/L (ref 0–35)
AST: 17 U/L (ref 0–37)
Albumin: 4.6 g/dL (ref 3.5–5.2)
Alkaline Phosphatase: 44 U/L (ref 39–117)
BUN: 17 mg/dL (ref 6–23)
CHLORIDE: 102 meq/L (ref 96–112)
CO2: 30 meq/L (ref 19–32)
Calcium: 9.4 mg/dL (ref 8.4–10.5)
Creatinine, Ser: 0.68 mg/dL (ref 0.40–1.20)
GFR: 93.72 mL/min (ref >60.00)
GLUCOSE: 109 mg/dL — AB (ref 70–99)
POTASSIUM: 4.3 meq/L (ref 3.5–5.1)
SODIUM: 139 meq/L (ref 135–145)
Total Bilirubin: 0.7 mg/dL (ref 0.2–1.2)
Total Protein: 7.4 g/dL (ref 6.0–8.3)

## 2017-10-19 LAB — CBC WITH DIFFERENTIAL/PLATELET
BASOS PCT: 1.2 % (ref 0.0–3.0)
Basophils Absolute: 0.1 10*3/uL (ref 0.0–0.1)
Eosinophils Absolute: 0.1 10*3/uL (ref 0.0–0.7)
Eosinophils Relative: 1.8 % (ref 0.0–5.0)
HCT: 39.7 % (ref 36.0–46.0)
Hemoglobin: 13.3 g/dL (ref 12.0–15.0)
LYMPHS PCT: 28.8 % (ref 12.0–46.0)
Lymphs Abs: 1.8 10*3/uL (ref 0.7–4.0)
MCHC: 33.5 g/dL (ref 30.0–36.0)
MCV: 88.1 fl (ref 78.0–100.0)
MONO ABS: 0.4 10*3/uL (ref 0.1–1.0)
Monocytes Relative: 6.9 % (ref 3.0–12.0)
NEUTROS PCT: 61.3 % (ref 43.0–77.0)
Neutro Abs: 3.8 10*3/uL (ref 1.4–7.7)
PLATELETS: 213 10*3/uL (ref 150.0–400.0)
RBC: 4.51 Mil/uL (ref 3.87–5.11)
RDW: 13 % (ref 11.5–15.5)
WBC: 6.2 10*3/uL (ref 4.0–10.5)

## 2017-10-19 LAB — VITAMIN B12: Vitamin B-12: 369 pg/mL (ref 211–911)

## 2017-10-19 LAB — LIPID PANEL
CHOL/HDL RATIO: 1
Cholesterol: 152 mg/dL (ref 0–200)
HDL: 102.5 mg/dL (ref >39.00)
LDL CALC: 45 mg/dL (ref 0–99)
NONHDL: 49.29
TRIGLYCERIDES: 23 mg/dL (ref 0.0–149.0)
VLDL: 4.6 mg/dL (ref 0.0–40.0)

## 2017-10-19 LAB — HEMOGLOBIN A1C: Hgb A1c MFr Bld: 5.9 % (ref 4.6–6.5)

## 2017-10-19 NOTE — Progress Notes (Signed)
Pre visit review using our clinic review tool, if applicable. No additional management support is needed unless otherwise documented below in the visit note. 

## 2017-10-19 NOTE — Progress Notes (Signed)
[name], female   DOB: [birth year], 62 y.o.   MRN: [id number]   Subjective:    [name], female    DOB: 13-Aug-1956, 62 y.o.   MRN: [id number]  HPI  Patient here for a complete physical exam.  She reports she is doing relatively well.  Recent left wrist fracture.  Going to therapy.  Followed by ortho.  Has f/u tomorrow.  She has restarted yoga.  Is exercising.  No chest pain.  No sob.  No acid reflux.  No abdominal pain.  Bowels moving.  No urine change.  She has a family history of ovarian cancer.  Request referral to GYN to discuss having her ovaries removed.  Handling stress.  Does not feel needs any further intervention.  Is concerned regarding memory issues.  Does not remember names, etc.     Past Medical History:  Diagnosis Date  . Fibrocystic breast    pt is BRACA neg   . Graves disease    had iodine treatment 1990's, now hypothyroid-   2016-2018   . Hypothyroidism   . Laceration of head    x2 post falls  . Lichen planus    Past Surgical History:  Procedure Laterality Date  . ABDOMINAL HYSTERECTOMY  2004  . BREAST BIOPSY Left 2002   needle bx  . COLONOSCOPY WITH PROPOFOL N/A 12/28/2015   Procedure: COLONOSCOPY WITH PROPOFOL;  Surgeon: Hulen Luster, MD;  Location: [facility] ENDOSCOPY;  Service: Gastroenterology;  Laterality: N/A;  . OPEN REDUCTION INTERNAL FIXATION (ORIF) DISTAL RADIAL FRACTURE Left 07/22/2017   Procedure: LEFT OPEN REDUCTION INTERNAL FIXATION (ORIF) DISTAL RADIAL FRACTURE AND REPAIR AS INDICATED;  Surgeon: Iran Planas, MD;  Location: [facility];  Service: Orthopedics;  Laterality: Left;  [facility] VAGINAL DELIVERY     had birth center delivery   Family History  Problem Relation Age of Onset  . Goiter Mother   . Mental illness Mother   . Ovarian cancer Mother        died 24  . Breast cancer Maternal Aunt 60  . Breast cancer Maternal Grandmother 80  . Goiter Sister   . Breast cancer Sister 44  . Cancer Maternal Aunt  13       Renal Cancer  . Colon cancer Neg Hx    Social History   Socioeconomic History  . Marital status: Married    Spouse name: None  . Number of children: 1  . Years of education: None  . Highest education level: None  Social Needs  . Financial resource strain: None  . Food insecurity - worry: None  . Food insecurity - inability: None  . Transportation needs - medical: None  . Transportation needs - non-medical: None  Occupational History    Employer: [employer]  Tobacco Use  . Smoking status: Former Smoker    Types: Cigarettes    Last attempt to quit: 08/16/1985    Years since quitting: 32.2  . Smokeless tobacco: Never Used  . Tobacco comment: smoked from age [age]  Substance and Sexual Activity  . Alcohol use: Yes    Alcohol/week: 0.0 oz    Comment: daily glass of wine  . Drug use: No  . Sexual activity: None  Other Topics Concern  . None  Social History Narrative  . None    Outpatient Encounter Medications as of 10/19/2017  Medication Sig  . levothyroxine (SYNTHROID, LEVOTHROID) 50 MCG tablet TAKE 1 TABLET BY MOUTH DAILY BEFORE BREAKFAST  .  progesterone (PROMETRIUM) 100 MG capsule TAKE ONE (1) TO TWO (2) CAPSULES BY MOUTH AT BEDTIME AS DIRECTED. (Patient taking differently: Take 200 mg by mouth at bedtime. )   No facility-administered encounter medications on file as of 10/19/2017.     Review of Systems  Constitutional: Negative for appetite change and unexpected weight change.  HENT: Negative for congestion and sinus pressure.   Eyes: Negative for pain and visual disturbance.  Respiratory: Negative for cough, chest tightness and shortness of breath.   Cardiovascular: Negative for chest pain, palpitations and leg swelling.  Gastrointestinal: Negative for abdominal pain, diarrhea, nausea and vomiting.  Genitourinary: Negative for difficulty urinating and dysuria.  Musculoskeletal: Negative for myalgias.       Wrist doing better.  Followed by ortho.  Seeing PT.     Skin: Negative for color change and rash.  Neurological: Negative for dizziness and headaches.  Hematological: Negative for adenopathy. Does not bruise/bleed easily.  Psychiatric/Behavioral: Negative for agitation and dysphoric mood.      Objective:     Blood pressure rechecked by me:  130/78  Physical Exam  Constitutional: She is oriented to person, place, and time. She appears well-developed and well-nourished. No distress.  HENT:  Nose: Nose normal.  Mouth/Throat: Oropharynx is clear and moist.  Eyes: Right eye exhibits no discharge. Left eye exhibits no discharge. No scleral icterus.  Neck: Neck supple. No thyromegaly present.  Cardiovascular: Normal rate and regular rhythm.  Pulmonary/Chest: Breath sounds normal. No accessory muscle usage. No tachypnea. No respiratory distress. She has no decreased breath sounds. She has no wheezes. She has no rhonchi. Right breast exhibits no inverted nipple, no mass, no nipple discharge and no tenderness (no axillary adenopathy). Left breast exhibits no inverted nipple, no mass, no nipple discharge and no tenderness (no axilarry adenopathy).  Abdominal: Soft. Bowel sounds are normal. There is no tenderness.  Musculoskeletal: She exhibits no edema or tenderness.  Lymphadenopathy:    She has no cervical adenopathy.  Neurological: She is alert and oriented to person, place, and time.  Skin: Skin is warm. No rash noted.  Psychiatric: She has a normal mood and affect. Her behavior is normal.    BP 134/76   Pulse 64   Temp 98.6 F (37 C) (Oral)   Ht '5\' 9"'  (1.753 m)   Wt 170 lb 12.8 oz (77.5 kg)   LMP 09/18/2003   SpO2 99%   BMI 25.22 kg/m  Wt Readings from Last 3 Encounters:  10/19/17 170 lb 12.8 oz (77.5 kg)  07/20/17 170 lb 3.2 oz (77.2 kg)  04/10/17 170 lb 1.6 oz (77.2 kg)     Lab Results  Component Value Date   WBC 6.2 10/19/2017   HGB 13.3 10/19/2017   HCT 39.7 10/19/2017   PLT 213.0 10/19/2017   GLUCOSE 109 (H) 10/19/2017    CHOL 152 10/19/2017   TRIG 23.0 10/19/2017   HDL 102.50 10/19/2017   LDLCALC 45 10/19/2017   ALT 14 10/19/2017   AST 17 10/19/2017   NA 139 10/19/2017   K 4.3 10/19/2017   CL 102 10/19/2017   CREATININE 0.68 10/19/2017   BUN 17 10/19/2017   CO2 30 10/19/2017   TSH 1.97 10/19/2017   HGBA1C 5.9 10/19/2017       Assessment & Plan:   Problem List Items Addressed This Visit    Family history of ovarian cancer    Has been referred for genetic testing.  See note.   She request referral to  gyn for evaluation to discuss having ovaries removed.        Relevant Orders   Ambulatory referral to Gynecology   Graves disease    On thyroid replacement.  Follow tsh.       Relevant Orders   TSH (Completed)   Health care maintenance    Physical today 10/19/17.  PAP 10/05/15 - negative with negative HPV.  S/p hysterectomy.  Colonoscopy 12/28/15.  Recommended f/u in 10 years.  Mammogram 11/15/16- [facility].  Scheduled for f/u mammogram.       History of wrist fracture    Seeing physical therapy.  Planning to f/u with ortho tomorrow.        Relevant Orders   DG Bone Density   Hyperglycemia    Low carb diet and exercise.  Follow met b and a1c.        Relevant Orders   Comprehensive metabolic panel (Completed)   Hemoglobin A1c (Completed)   Memory change    Persistent issues with memory change.  Discussed with her regarding increased stress, concentration, etc could be contributing.  Given persistent concern, she request referral to neurology for further evaluation and w/up.  Check routine labs including tsh and B12.        Relevant Orders   CBC with Differential/Platelet (Completed)   TSH (Completed)   Lipid panel (Completed)   Vitamin B12 (Completed)   Ambulatory referral to Neurology   Thyroid fullness    Previous thyroid ultrasound - no nodules.  No tenderness.         Other Visit Diagnoses    Routine general medical examination at a health care facility    -  Primary    Estrogen deficiency       Relevant Orders   DG Bone Density   Breast cancer screening       Relevant Orders   MM SCREENING BREAST TOMO BILATERAL       Einar Pheasant, MD

## 2017-10-20 ENCOUNTER — Encounter: Payer: Self-pay | Admitting: Internal Medicine

## 2017-10-22 ENCOUNTER — Encounter: Payer: Self-pay | Admitting: Internal Medicine

## 2017-10-22 NOTE — Assessment & Plan Note (Signed)
Has been referred for genetic testing.  See note.   She request referral to gyn for evaluation to discuss having ovaries removed.

## 2017-10-22 NOTE — Assessment & Plan Note (Signed)
Persistent issues with memory change.  Discussed with her regarding increased stress, concentration, etc could be contributing.  Given persistent concern, she request referral to neurology for further evaluation and w/up.  Check routine labs including tsh and B12.

## 2017-10-22 NOTE — Assessment & Plan Note (Signed)
Previous thyroid ultrasound - no nodules.  No tenderness.

## 2017-10-22 NOTE — Assessment & Plan Note (Signed)
Seeing physical therapy.  Planning to f/u with ortho tomorrow.

## 2017-10-22 NOTE — Assessment & Plan Note (Signed)
Low carb diet and exercise.  Follow met b and a1c.   

## 2017-10-22 NOTE — Assessment & Plan Note (Signed)
Physical today 10/19/17.  PAP 10/05/15 - negative with negative HPV.  S/p hysterectomy.  Colonoscopy 12/28/15.  Recommended f/u in 10 years.  Mammogram 11/15/16- [facility] I.  Scheduled for f/u mammogram.

## 2017-10-22 NOTE — Assessment & Plan Note (Signed)
On thyroid replacement.  Follow tsh.  

## 2017-11-16 ENCOUNTER — Other Ambulatory Visit: Payer: 59

## 2017-11-29 ENCOUNTER — Ambulatory Visit
Admission: RE | Admit: 2017-11-29 | Discharge: 2017-11-29 | Disposition: A | Discharge status: Home / Self Care | Payer: 59 | Source: Ambulatory Visit | Attending: Internal Medicine | Admitting: Internal Medicine

## 2017-11-29 DIAGNOSIS — M85851 Other specified disorders of bone density and structure, right thigh: Secondary | ICD-10-CM | POA: Diagnosis not present

## 2017-11-29 DIAGNOSIS — Z1231 Encounter for screening mammogram for malignant neoplasm of breast: Secondary | ICD-10-CM | POA: Insufficient documentation

## 2017-11-29 DIAGNOSIS — E2839 Other primary ovarian failure: Secondary | ICD-10-CM

## 2017-11-29 DIAGNOSIS — Z1239 Encounter for other screening for malignant neoplasm of breast: Secondary | ICD-10-CM

## 2017-11-29 DIAGNOSIS — Z8781 Personal history of (healed) traumatic fracture: Secondary | ICD-10-CM | POA: Diagnosis not present

## 2017-11-30 ENCOUNTER — Other Ambulatory Visit: Payer: Self-pay

## 2017-11-30 ENCOUNTER — Encounter
Admission: RE | Admit: 2017-11-30 | Discharge: 2017-11-30 | Disposition: A | Discharge status: Home / Self Care | Payer: 59 | Source: Ambulatory Visit | Attending: Obstetrics & Gynecology | Admitting: Obstetrics & Gynecology

## 2017-11-30 DIAGNOSIS — Z01812 Encounter for preprocedural laboratory examination: Secondary | ICD-10-CM | POA: Insufficient documentation

## 2017-11-30 DIAGNOSIS — Z01818 Encounter for other preprocedural examination: Secondary | ICD-10-CM | POA: Diagnosis present

## 2017-11-30 DIAGNOSIS — Z0183 Encounter for blood typing: Secondary | ICD-10-CM | POA: Insufficient documentation

## 2017-11-30 DIAGNOSIS — R9431 Abnormal electrocardiogram [ECG] [EKG]: Secondary | ICD-10-CM | POA: Insufficient documentation

## 2017-11-30 DIAGNOSIS — I4510 Unspecified right bundle-branch block: Secondary | ICD-10-CM | POA: Diagnosis not present

## 2017-11-30 HISTORY — DX: Other complications of anesthesia, initial encounter: T88.59XA

## 2017-11-30 HISTORY — DX: Adverse effect of unspecified anesthetic, initial encounter: T41.45XA

## 2017-11-30 LAB — CBC
HEMATOCRIT: 39.8 % (ref 35.0–47.0)
HEMOGLOBIN: 13.3 g/dL (ref 12.0–16.0)
MCH: 29.4 pg (ref 26.0–34.0)
MCHC: 33.5 g/dL (ref 32.0–36.0)
MCV: 87.9 fL (ref 80.0–100.0)
Platelets: 212 10*3/uL (ref 150–440)
RBC: 4.53 MIL/uL (ref 3.80–5.20)
RDW: 13.1 % (ref 11.5–14.5)
WBC: 6.4 10*3/uL (ref 3.6–11.0)

## 2017-11-30 LAB — BASIC METABOLIC PANEL
ANION GAP: 12 (ref 5–15)
BUN: 19 mg/dL (ref 6–20)
CHLORIDE: 102 mmol/L (ref 101–111)
CO2: 25 mmol/L (ref 22–32)
Calcium: 9.4 mg/dL (ref 8.9–10.3)
Creatinine, Ser: 0.77 mg/dL (ref 0.44–1.00)
GFR calc Af Amer: >60 mL/min (ref >60)
GFR calc non Af Amer: >60 mL/min (ref >60)
Glucose, Bld: 91 mg/dL (ref 65–99)
POTASSIUM: 3.9 mmol/L (ref 3.5–5.1)
SODIUM: 139 mmol/L (ref 135–145)

## 2017-11-30 LAB — TYPE AND SCREEN
ABO/RH(D): A POS
ANTIBODY SCREEN: NEGATIVE

## 2017-11-30 NOTE — Patient Instructions (Signed)
Your procedure is scheduled on: Friday, December 08, 2017  Report [facility] To find out your arrival time, please call [phone] between 1PM - 3PM on: Thursday, December 07, 2017  REMEMBER: Instructions that are not followed completely may result in serious medical risk, up to and including death; or upon the discretion of your surgeon and anesthesiologist your surgery may need to be rescheduled.  Do not eat food after midnight the night before your procedure.  No gum chewing, lozengers or hard candies.  You may however, drink CLEAR liquids up to 2 hours before you are scheduled to arrive for your surgery. Do not drink anything within 2 hours of the start of your surgery.  Clear liquids include: - water  - apple juice without pulp - clear gatorade - black coffee or tea (Do NOT add anything to the coffee or tea) Do NOT drink anything that is not on this list.  No Alcohol for 24 hours before or after surgery.  No Smoking including e-cigarettes for 24 hours prior to surgery.  No chewable tobacco products for at least 6 hours prior to surgery.  No nicotine patches on the day of surgery.  On the morning of surgery brush your teeth with toothpaste and water, you may rinse your mouth with mouthwash if you wish. Do not swallow any toothpaste or mouthwash.  Notify your doctor if there is any change in your medical condition (cold, fever, infection).  Do not wear jewelry, make-up, hairpins, clips or nail polish.  Do not wear lotions, powders, or perfumes. You may wear deodorant.  Do not shave 48 hours prior to surgery.  Contacts and dentures may not be worn into surgery.  Do not bring valuables to the hospital, including drivers license, insurance or credit cards.  [facility] is not responsible for any belongings or valuables.   TAKE THESE MEDICATIONS THE MORNING OF SURGERY:  1.  LEVOTHYROXINE  Use CHG Soap as directed on instruction  sheet.  NOW!  Stop Anti-inflammatories (NSAIDS) such as Advil, Aleve, Ibuprofen, Motrin, Naproxen, Naprosyn and Aspirin based products such as Excedrin, Goodys Powder, BC Powder. (May take Tylenol or Acetaminophen if needed.)  NOW!  Stop ANY OVER THE COUNTER supplements until after surgery. (May continue Vitamin B.)  Wear comfortable clothing (specific to your surgery type) to the hospital.  Plan for stool softeners for home use.  If you are being admitted to the hospital overnight, leave your suitcase in the car. After surgery it may be brought to your room.  If you are being discharged the day of surgery, you will not be allowed to drive home. You will need a responsible adult to drive you home and stay with you that night.   If you are taking public transportation, you will need to have a responsible adult with you. Please confirm with your physician that it is acceptable to use public transportation.   Please call [phone] if you have any questions about these instructions.

## 2017-12-01 LAB — FOLLICLE STIMULATING HORMONE: FSH: 110.7 m[IU]/mL

## 2017-12-08 ENCOUNTER — Ambulatory Visit: Payer: Commercial Managed Care - HMO | Admitting: Certified Registered"

## 2017-12-08 ENCOUNTER — Encounter
Admission: RE | Disposition: A | Discharge status: Home / Self Care | Payer: Self-pay | Source: Ambulatory Visit | Attending: Obstetrics & Gynecology

## 2017-12-08 ENCOUNTER — Other Ambulatory Visit: Payer: Self-pay

## 2017-12-08 ENCOUNTER — Ambulatory Visit
Admission: RE | Admit: 2017-12-08 | Discharge: 2017-12-08 | Disposition: A | Discharge status: Home / Self Care | Payer: Commercial Managed Care - HMO | Source: Ambulatory Visit | Attending: Obstetrics & Gynecology | Admitting: Obstetrics & Gynecology

## 2017-12-08 DIAGNOSIS — Z8349 Family history of other endocrine, nutritional and metabolic diseases: Secondary | ICD-10-CM | POA: Diagnosis not present

## 2017-12-08 DIAGNOSIS — Z883 Allergy status to other anti-infective agents status: Secondary | ICD-10-CM | POA: Diagnosis not present

## 2017-12-08 DIAGNOSIS — N6019 Diffuse cystic mastopathy of unspecified breast: Secondary | ICD-10-CM | POA: Insufficient documentation

## 2017-12-08 DIAGNOSIS — Z886 Allergy status to analgesic agent status: Secondary | ICD-10-CM | POA: Insufficient documentation

## 2017-12-08 DIAGNOSIS — Z79899 Other long term (current) drug therapy: Secondary | ICD-10-CM | POA: Insufficient documentation

## 2017-12-08 DIAGNOSIS — Z888 Allergy status to other drugs, medicaments and biological substances status: Secondary | ICD-10-CM | POA: Insufficient documentation

## 2017-12-08 DIAGNOSIS — Z8041 Family history of malignant neoplasm of ovary: Secondary | ICD-10-CM | POA: Diagnosis not present

## 2017-12-08 DIAGNOSIS — Z803 Family history of malignant neoplasm of breast: Secondary | ICD-10-CM | POA: Diagnosis not present

## 2017-12-08 DIAGNOSIS — Z4002 Encounter for prophylactic removal of ovary: Secondary | ICD-10-CM | POA: Diagnosis present

## 2017-12-08 DIAGNOSIS — Z90710 Acquired absence of both cervix and uterus: Secondary | ICD-10-CM | POA: Diagnosis not present

## 2017-12-08 DIAGNOSIS — Z87891 Personal history of nicotine dependence: Secondary | ICD-10-CM | POA: Insufficient documentation

## 2017-12-08 DIAGNOSIS — Z8051 Family history of malignant neoplasm of kidney: Secondary | ICD-10-CM | POA: Insufficient documentation

## 2017-12-08 DIAGNOSIS — E0500 Thyrotoxicosis with diffuse goiter without thyrotoxic crisis or storm: Secondary | ICD-10-CM | POA: Insufficient documentation

## 2017-12-08 DIAGNOSIS — Z880 Allergy status to penicillin: Secondary | ICD-10-CM | POA: Insufficient documentation

## 2017-12-08 DIAGNOSIS — N736 Female pelvic peritoneal adhesions (postinfective): Secondary | ICD-10-CM | POA: Insufficient documentation

## 2017-12-08 DIAGNOSIS — E039 Hypothyroidism, unspecified: Secondary | ICD-10-CM | POA: Insufficient documentation

## 2017-12-08 HISTORY — PX: LYSIS OF ADHESION: SHX5961

## 2017-12-08 HISTORY — PX: LAPAROSCOPIC BILATERAL SALPINGO OOPHERECTOMY: SHX5890

## 2017-12-08 HISTORY — PX: LAPAROSCOPIC LYSIS OF ADHESIONS: SHX5905

## 2017-12-08 LAB — ABO/RH: ABO/RH(D): A POS

## 2017-12-08 SURGERY — SALPINGO-OOPHORECTOMY, BILATERAL, LAPAROSCOPIC
Anesthesia: General | Laterality: Bilateral

## 2017-12-08 MED ORDER — CELECOXIB 200 MG PO CAPS
ORAL_CAPSULE | ORAL | Status: AC
  Administered 2017-12-08: 400 mg via ORAL
  Filled 2017-12-08: qty 2

## 2017-12-08 MED ORDER — GABAPENTIN 300 MG PO CAPS
ORAL_CAPSULE | ORAL | Status: AC
  Administered 2017-12-08: 600 mg via ORAL
  Filled 2017-12-08: qty 2

## 2017-12-08 MED ORDER — ACETAMINOPHEN 500 MG PO TABS
1000.0000 mg | ORAL_TABLET | Freq: Once | ORAL | Status: AC
  Administered 2017-12-08: 1000 mg via ORAL

## 2017-12-08 MED ORDER — OXYCODONE HCL 5 MG/5ML PO SOLN: 5.0000 mg | Freq: Once | ORAL | Status: DC | PRN

## 2017-12-08 MED ORDER — FENTANYL CITRATE (PF) 100 MCG/2ML IJ SOLN
25.0000 ug | INTRAMUSCULAR | Status: DC | PRN
  Administered 2017-12-08: 25 ug via INTRAVENOUS

## 2017-12-08 MED ORDER — CELECOXIB 200 MG PO CAPS
400.0000 mg | ORAL_CAPSULE | Freq: Once | ORAL | Status: AC
  Administered 2017-12-08: 400 mg via ORAL

## 2017-12-08 MED ORDER — PROPOFOL 10 MG/ML IV BOLUS
INTRAVENOUS | Status: AC
  Filled 2017-12-08: qty 20

## 2017-12-08 MED ORDER — FENTANYL CITRATE (PF) 100 MCG/2ML IJ SOLN
INTRAMUSCULAR | Status: AC
  Filled 2017-12-08: qty 2

## 2017-12-08 MED ORDER — FAMOTIDINE 20 MG PO TABS
20.0000 mg | ORAL_TABLET | Freq: Once | ORAL | Status: AC
  Administered 2017-12-08: 20 mg via ORAL

## 2017-12-08 MED ORDER — LIDOCAINE-EPINEPHRINE 1 %-1:100000 IJ SOLN
INTRAMUSCULAR | Status: AC
  Filled 2017-12-08: qty 1

## 2017-12-08 MED ORDER — PROPOFOL 10 MG/ML IV BOLUS
INTRAVENOUS | Status: DC | PRN
  Administered 2017-12-08: 150 mg via INTRAVENOUS

## 2017-12-08 MED ORDER — SUGAMMADEX SODIUM 200 MG/2ML IV SOLN
INTRAVENOUS | Status: AC
  Filled 2017-12-08: qty 2

## 2017-12-08 MED ORDER — LIDOCAINE-EPINEPHRINE (PF) 1 %-1:200000 IJ SOLN
INTRAMUSCULAR | Status: AC
  Filled 2017-12-08: qty 30

## 2017-12-08 MED ORDER — ACETAMINOPHEN 500 MG PO TABS
ORAL_TABLET | ORAL | Status: AC
  Administered 2017-12-08: 1000 mg via ORAL
  Filled 2017-12-08: qty 2

## 2017-12-08 MED ORDER — DEXAMETHASONE SODIUM PHOSPHATE 10 MG/ML IJ SOLN
INTRAMUSCULAR | Status: DC | PRN
  Administered 2017-12-08: 6 mg via INTRAVENOUS

## 2017-12-08 MED ORDER — FAMOTIDINE 20 MG PO TABS
ORAL_TABLET | ORAL | Status: AC
  Administered 2017-12-08: 20 mg via ORAL
  Filled 2017-12-08: qty 1

## 2017-12-08 MED ORDER — ROCURONIUM BROMIDE 100 MG/10ML IV SOLN
INTRAVENOUS | Status: DC | PRN
  Administered 2017-12-08: 50 mg via INTRAVENOUS

## 2017-12-08 MED ORDER — DEXAMETHASONE SODIUM PHOSPHATE 10 MG/ML IJ SOLN
INTRAMUSCULAR | Status: AC
  Filled 2017-12-08: qty 1

## 2017-12-08 MED ORDER — ROCURONIUM BROMIDE 50 MG/5ML IV SOLN
INTRAVENOUS | Status: AC
  Filled 2017-12-08: qty 1

## 2017-12-08 MED ORDER — GABAPENTIN 300 MG PO CAPS
600.0000 mg | ORAL_CAPSULE | Freq: Once | ORAL | Status: AC
  Administered 2017-12-08: 600 mg via ORAL

## 2017-12-08 MED ORDER — OXYCODONE HCL 5 MG PO TABS: 5.0000 mg | ORAL_TABLET | ORAL | 0 refills | Status: DC | PRN

## 2017-12-08 MED ORDER — IBUPROFEN 800 MG PO TABS: 800.0000 mg | ORAL_TABLET | Freq: Four times a day (QID) | ORAL | 0 refills | Status: DC

## 2017-12-08 MED ORDER — LIDOCAINE HCL (PF) 2 % IJ SOLN
INTRAMUSCULAR | Status: AC
  Filled 2017-12-08: qty 10

## 2017-12-08 MED ORDER — MIDAZOLAM HCL 2 MG/2ML IJ SOLN
INTRAMUSCULAR | Status: AC
  Filled 2017-12-08: qty 2

## 2017-12-08 MED ORDER — MIDAZOLAM HCL 2 MG/2ML IJ SOLN
INTRAMUSCULAR | Status: DC | PRN
  Administered 2017-12-08: 2 mg via INTRAVENOUS

## 2017-12-08 MED ORDER — OXYCODONE HCL 5 MG PO TABS: 5.0000 mg | ORAL_TABLET | Freq: Once | ORAL | Status: DC | PRN

## 2017-12-08 MED ORDER — EPHEDRINE SULFATE 50 MG/ML IJ SOLN
INTRAMUSCULAR | Status: DC | PRN
  Administered 2017-12-08: 10 mg via INTRAVENOUS

## 2017-12-08 MED ORDER — LIDOCAINE HCL (CARDIAC) 20 MG/ML IV SOLN
INTRAVENOUS | Status: DC | PRN
  Administered 2017-12-08: 100 mg via INTRAVENOUS

## 2017-12-08 MED ORDER — FENTANYL CITRATE (PF) 100 MCG/2ML IJ SOLN
INTRAMUSCULAR | Status: DC | PRN
  Administered 2017-12-08 (×2): 50 ug via INTRAVENOUS

## 2017-12-08 MED ORDER — ONDANSETRON HCL 4 MG/2ML IJ SOLN
INTRAMUSCULAR | Status: DC | PRN
  Administered 2017-12-08: 4 mg via INTRAVENOUS

## 2017-12-08 MED ORDER — ONDANSETRON HCL 4 MG/2ML IJ SOLN
INTRAMUSCULAR | Status: AC
  Filled 2017-12-08: qty 2

## 2017-12-08 MED ORDER — LACTATED RINGERS IV SOLN
INTRAVENOUS | Status: DC
  Administered 2017-12-08: 06:00:00 via INTRAVENOUS

## 2017-12-08 SURGICAL SUPPLY — 39 items
BAG URINE DRAINAGE (UROLOGICAL SUPPLIES) ×3 IMPLANT
BLADE SURG SZ11 CARB STEEL (BLADE) ×3 IMPLANT
CANISTER SUCT 1200ML W/VALVE (MISCELLANEOUS) ×3 IMPLANT
CATH FOLEY 2WAY  5CC 16FR (CATHETERS) ×1
CATH URTH 16FR FL 2W BLN LF (CATHETERS) ×2 IMPLANT
CHLORAPREP W/TINT 26ML (MISCELLANEOUS) ×3 IMPLANT
DERMABOND ADVANCED (GAUZE/BANDAGES/DRESSINGS) ×1
DERMABOND ADVANCED .7 DNX12 (GAUZE/BANDAGES/DRESSINGS) ×2 IMPLANT
DRAPE LEGGINS SURG 28X43 STRL (DRAPES) ×3 IMPLANT
DRAPE UNDER BUTTOCK W/FLU (DRAPES) ×3 IMPLANT
GLOVE PI ORTHOPRO 6.5 (GLOVE) ×1
GLOVE PI ORTHOPRO STRL 6.5 (GLOVE) ×2 IMPLANT
GLOVE SURG SYN 6.5 ES PF (GLOVE) ×3 IMPLANT
GOWN STRL REUS W/ TWL LRG LVL3 (GOWN DISPOSABLE) ×4 IMPLANT
GOWN STRL REUS W/ TWL XL LVL3 (GOWN DISPOSABLE) ×2 IMPLANT
GOWN STRL REUS W/TWL LRG LVL3 (GOWN DISPOSABLE) ×2
GOWN STRL REUS W/TWL XL LVL3 (GOWN DISPOSABLE) ×1
IRRIGATION STRYKERFLOW (MISCELLANEOUS) IMPLANT
IRRIGATOR STRYKERFLOW (MISCELLANEOUS)
IV LACTATED RINGERS 1000ML (IV SOLUTION) ×3 IMPLANT
KIT PINK PAD W/HEAD ARE REST (MISCELLANEOUS) ×3
KIT PINK PAD W/HEAD ARM REST (MISCELLANEOUS) ×2 IMPLANT
KIT TURNOVER CYSTO (KITS) ×3 IMPLANT
LABEL OR SOLS (LABEL) IMPLANT
LIGASURE VESSEL 5MM BLUNT TIP (ELECTROSURGICAL) ×3 IMPLANT
NS IRRIG 500ML POUR BTL (IV SOLUTION) ×3 IMPLANT
PACK LAP CHOLECYSTECTOMY (MISCELLANEOUS) ×3 IMPLANT
PAD OB MATERNITY 4.3X12.25 (PERSONAL CARE ITEMS) ×3 IMPLANT
PAD PREP 24X41 OB/GYN DISP (PERSONAL CARE ITEMS) ×3 IMPLANT
POUCH ENDO CATCH 10MM SPEC (MISCELLANEOUS) ×3 IMPLANT
SCISSORS METZENBAUM CVD 33 (INSTRUMENTS) ×3 IMPLANT
SLEEVE ENDOPATH XCEL 5M (ENDOMECHANICALS) ×6 IMPLANT
SUT MNCRL 4-0 (SUTURE) ×1
SUT MNCRL 4-0 27XMFL (SUTURE) ×2
SUT MNCRL AB 4-0 PS2 18 (SUTURE) ×3 IMPLANT
SUT VIC AB 0 SH 27 (SUTURE) ×3 IMPLANT
SUTURE MNCRL 4-0 27XMF (SUTURE) ×2 IMPLANT
TROCAR XCEL NON-BLD 5MMX100MML (ENDOMECHANICALS) ×3 IMPLANT
TUBING INSUFFLATION (TUBING) ×3 IMPLANT

## 2017-12-08 NOTE — Anesthesia Postprocedure Evaluation (Signed)
Anesthesia Post Note  Patient: Jodi MiniumLucy Campbell Kissling  Procedure(s) Performed: LAPAROSCOPIC BILATERAL SALPINGO OOPHORECTOMY (Bilateral ) LAPAROSCOPIC LYSIS OF ADHESIONS URETERAL LYSIS  Patient location during evaluation: PACU Anesthesia Type: General Level of consciousness: awake and alert Pain management: pain level controlled Vital Signs Assessment: post-procedure vital signs reviewed and stable Respiratory status: spontaneous breathing, nonlabored ventilation, respiratory function stable and patient connected to nasal cannula oxygen Cardiovascular status: blood pressure returned to baseline and stable Postop Assessment: no apparent nausea or vomiting Anesthetic complications: no     Last Vitals:  Vitals:   12/08/17 0937 12/08/17 1016  BP: 120/64 132/63  Pulse: 60 62  Resp: 12 14  Temp:  36.5 C  SpO2: 100% 99%    Last Pain:  Vitals:   12/08/17 1016  TempSrc: Temporal  PainSc: 1                  Cleda MccreedyJoseph K Piscitello

## 2017-12-08 NOTE — Discharge Instructions (Addendum)
Discharge instructions:  Call office if you have any of the following: fever >101 F, chills, excessive vaginal bleeding, incision drainage or problems, leg pain or redness, or any other concerns.   Activity: Do not lift > 10 lbs for 2 weeks.  No driving for 1 week, or until you are certain you can slam on the brakes in an emergency. Card playing can resume immediately.  You may feel some pain in your upper right abdomen/rib and right shoulder.  This is from the gas in the abdomen for surgery. This will subside over time, please be patient!  Take 800mg  Ibuprofen and 1000mg  Tylenol around the clock, every 6 hours for at least the first 3-5 days.  After this you can take as needed.  This will help decrease inflammation and promote healing.  The narcotics you'll take just as needed, as they just trick your brain into thinking its not in pain.    Please don't limit yourself in terms of routine activity.  You will be able to do most things, although they may take longer to do or be a little painful.  You can do it!  Don't be a hero, but don't be a wimp either!    AMBULATORY SURGERY  DISCHARGE INSTRUCTIONS   1) The drugs that you were given will stay in your system until tomorrow so for the next 24 hours you should not:  A) Drive an automobile B) Make any legal decisions C) Drink any alcoholic beverage   2) You may resume regular meals tomorrow.  Today it is better to start with liquids and gradually work up to solid foods.  You may eat anything you prefer, but it is better to start with liquids, then soup and crackers, and gradually work up to solid foods.   3) Please notify your doctor immediately if you have any unusual bleeding, trouble breathing, redness and pain at the surgery site, drainage, fever, or pain not relieved by medication.    4) Additional Instructions:        Please contact your physician with any problems or Same Day Surgery at [phone], Monday through  Friday 6 am to 4 pm, or [place] [facility] Main number at [phone].

## 2017-12-08 NOTE — Anesthesia Procedure Notes (Signed)
Procedure Name: Intubation Date/Time: 12/08/2017 7:46 AM Performed by: Philbert Riser, CRNA Pre-anesthesia Checklist: Patient identified, Emergency Drugs available, Suction available, Patient being monitored and Timeout performed Patient Re-evaluated:Patient Re-evaluated prior to induction Oxygen Delivery Method: Circle system utilized and Simple face mask Preoxygenation: Pre-oxygenation with 100% oxygen Induction Type: IV induction Ventilation: Mask ventilation without difficulty Laryngoscope Size: Mac and 3 Grade View: Grade I Tube type: Oral Number of attempts: 1 Airway Equipment and Method: Stylet

## 2017-12-08 NOTE — H&P (Signed)
Preoperative History and Physical  Jodi Stewart is a 62 y.o. with a family history of ovarian cancer, who wishes to have prophylactic surgery to remove her bilateral tubes and ovaries to reduce her own risk.    She understands the total risk reduction is unknown, but substantial.  Proposed surgery: laparoscopic bilateral salpingo-oophorectomy  Patient has already had a hysterectomy.  Past Medical History:  Diagnosis Date  . Complication of anesthesia    long term anesthesia effects...months later  . Fibrocystic breast    pt is BRACA neg   . Graves disease    had iodine treatment 1990's, now hypothyroid-   2016-2018   . Hypothyroidism   . Laceration of head    x2 post falls  . Lichen planus    Past Surgical History:  Procedure Laterality Date  . ABDOMINAL HYSTERECTOMY  2004  . BREAST BIOPSY Left 2002   needle bx  . COLONOSCOPY WITH PROPOFOL N/A 12/28/2015   Procedure: COLONOSCOPY WITH PROPOFOL;  Surgeon: Wallace Cullens, MD;  Location: [facility];  Service: Gastroenterology;  Laterality: N/A;  . OPEN REDUCTION INTERNAL FIXATION (ORIF) DISTAL RADIAL FRACTURE Left 07/22/2017   Procedure: LEFT OPEN REDUCTION INTERNAL FIXATION (ORIF) DISTAL RADIAL FRACTURE AND REPAIR AS INDICATED;  Surgeon: Bradly Bienenstock, MD;  Location: [facility] OR;  Service: Orthopedics;  Laterality: Left;  Marland Kitchen VAGINAL DELIVERY     had birth center delivery   OB History  No data available  Patient denies any other pertinent gynecologic issues.   No current facility-administered medications on file prior to encounter.    Current Outpatient Medications on File Prior to Encounter  Medication Sig Dispense Refill  . Alpha-Lipoic Acid 600 MG CAPS Take 600 mg by mouth every evening.    . Docosahexaenoic Acid (DHA PO) Take 1,000 mg by mouth daily with supper. 500 MG PER CAPSULE    . levothyroxine (SYNTHROID, LEVOTHROID) 50 MCG tablet TAKE 1 TABLET BY MOUTH DAILY BEFORE BREAKFAST 30 tablet 2  . progesterone  (PROMETRIUM) 100 MG capsule TAKE ONE (1) TO TWO (2) CAPSULES BY MOUTH AT BEDTIME AS DIRECTED. (Patient taking differently: Take 100 mg by mouth at bedtime. ) 60 capsule 2  . RESVERATROL PO Take 200 mg by mouth daily with supper.    . vitamin B-12 (CYANOCOBALAMIN) 500 MCG tablet Take 500 mcg by mouth daily with supper.     Allergies  Allergen Reactions  . Tioconazole Itching  . Aspirin Rash    Welts- childhood allergy  . Monistat [Miconazole] Itching and Other (See Comments)    Itching/discomfort  . Penicillins Rash    Has patient had a PCN reaction causing immediate rash, facial/tongue/throat swelling, SOB or lightheadedness with hypotension: Unknown Has patient had a PCN reaction causing severe rash involving mucus membranes or skin necrosis: Unknown Has patient had a PCN reaction that required hospitalization: No Has patient had a PCN reaction occurring within the last 10 years: No If all of the above answers are "NO", then may proceed with Cephalosporin use.  Childhood allergy    Social History:   reports that she quit smoking about 32 years ago. Her smoking use included cigarettes. she has never used smokeless tobacco. She reports that she drinks alcohol. She reports that she does not use drugs.  Family History  Problem Relation Age of Onset  . Goiter Mother   . Mental illness Mother   . Ovarian cancer Mother        died 55  . Breast cancer Maternal Aunt  60  . Breast cancer Maternal Grandmother 80  . Goiter Sister   . Breast cancer Sister 74  . Cancer Maternal Aunt 72       Renal Cancer  . Colon cancer Neg Hx     Review of Systems: Noncontributory  PHYSICAL EXAM: Blood pressure (!) 122/56, pulse 63, temperature 98 F (36.7 C), temperature source Temporal, resp. rate 16, last menstrual period 09/18/2003, SpO2 100 %. General appearance - alert, well appearing, and in no distress Chest - clear to auscultation, no wheezes, rales or rhonchi, symmetric air entry Heart -  normal rate and regular rhythm Abdomen - soft, nontender, nondistended, no masses or organomegaly Pelvic - examination not indicated Extremities - peripheral pulses normal, no pedal edema, no clubbing or cyanosis  Labs: Results for orders placed or performed during the hospital encounter of 11/30/17 (from the past 336 hour(s))  Follicle stimulating hormone   Collection Time: 11/30/17  1:55 PM  Result Value Ref Range   FSH 110.7 mIU/mL  Basic metabolic panel   Collection Time: 11/30/17  1:55 PM  Result Value Ref Range   Sodium 139 135 - 145 mmol/L   Potassium 3.9 3.5 - 5.1 mmol/L   Chloride 102 101 - 111 mmol/L   CO2 25 22 - 32 mmol/L   Glucose, Bld 91 65 - 99 mg/dL   BUN 19 6 - 20 mg/dL   Creatinine, Ser 1.61 0.44 - 1.00 mg/dL   Calcium 9.4 8.9 - 09.6 mg/dL   GFR calc non Af Amer >60 >60 mL/min   GFR calc Af Amer >60 >60 mL/min   Anion gap 12 5 - 15  CBC   Collection Time: 11/30/17  1:55 PM  Result Value Ref Range   WBC 6.4 3.6 - 11.0 K/uL   RBC 4.53 3.80 - 5.20 MIL/uL   Hemoglobin 13.3 12.0 - 16.0 g/dL   HCT 04.5 40.9 - 81.1 %   MCV 87.9 80.0 - 100.0 fL   MCH 29.4 26.0 - 34.0 pg   MCHC 33.5 32.0 - 36.0 g/dL   RDW 91.4 78.2 - 95.6 %   Platelets 212 150 - 440 K/uL  Type and screen [facility]   Collection Time: 11/30/17  1:55 PM  Result Value Ref Range   ABO/RH(D) A POS    Antibody Screen NEG    Sample Expiration 12/14/2017    Extend sample reason      NO TRANSFUSIONS OR PREGNANCY IN THE PAST 3 MONTHS Performed at [facility], [address] [place], [place] [address]     Imaging Studies: Dg Bone Density  Result Date: 11/29/2017 EXAM: DUAL X-RAY ABSORPTIOMETRY (DXA) FOR BONE MINERAL DENSITY IMPRESSION: Dear Dr. Lorin Picket, Your patient Jodi Stewart completed a BMD test on 11/29/2017 using the Lunar iDXA DXA System (analysis version: 14.10) manufactured by [employer]. The following summarizes the results of our evaluation. PATIENT  BIOGRAPHICAL: Name: Jodi, Stewart Patient ID: [id number] Birth Date: 04/07/1956 Height: 69.0 in. Gender: Female Exam Date: 11/29/2017 Weight: 166.2 lbs. Indications: Caucasian, Height Loss, Hypothyroid, Hysterectomy, Postmenopausal Fractures: Left wrist Treatments: Calcium, Synthroid, Vitamin D ASSESSMENT: The BMD measured at Femur Neck Right is 0.797 g/cm2 with a T-score of -1.7. This patient is considered osteopenic according to World Health Organization [facility]) criteria. Lumbar spine was not utilized due to advanced degenerative changes. Site Region Measured Measured WHO Young Adult BMD Date       Age      Classification T-score DualFemur Neck Right 11/29/2017  60.3 Osteopenia -1.7 0.797 g/cm2 Right Forearm Radius 33% 11/29/2017 60.3 Normal -1.0 0.793 g/cm2 World Health Organization [facility](WHO) criteria for post-menopausal, Caucasian Women: Normal:       T-score at or above -1 SD Osteopenia:   T-score between -1 and -2.5 SD Osteoporosis: T-score at or below -2.5 SD RECOMMENDATIONS: National Osteoporosis Foundation recommends that FDA-approved medical therapies be considered in postmenopausal women and men age 61 or older with a: 1. Hip or vertebral (clinical or morphometric) fracture. 2. T-score of < -2.5 at the spine or hip. 3. Ten-year fracture probability by FRAX of 3% or greater for hip fracture or 20% or greater for major osteoporotic fracture. All treatment decisions require clinical judgment and consideration of individual patient factors, including patient preferences, co-morbidities, previous drug use, risk factors not captured in the FRAX model (e.g. falls, vitamin D deficiency, increased bone turnover, interval significant decline in bone density) and possible under - or over-estimation of fracture risk by FRAX. All patients should ensure an adequate intake of dietary calcium (1200 mg/d) and vitamin D (800 IU daily) unless contraindicated. FOLLOW-UP: People with diagnosed cases of osteoporosis or at high  risk for fracture should have regular bone mineral density tests. For patients eligible for Medicare, routine testing is allowed once every 2 years. The testing frequency can be increased to one year for patients who have rapidly progressing disease, those who are receiving or discontinuing medical therapy to restore bone mass, or have additional risk factors. I have reviewed this report, and agree with the above findings. [facility] Dear Dr. Lorin PicketScott, Your patient Reather ConverseLucy C Azimi completed a FRAX assessment on 11/29/2017 using the Lunar iDXA DXA System (analysis version: 14.10) manufactured by [employer]. The following summarizes the results of our evaluation. PATIENT BIOGRAPHICAL: Name: Morene AntuRippenhagen, Jyssica C Patient ID: [id number] Birth Date: 12/21/1955 Height:    69.0 in. Gender:     Female    Age:        61.3       Weight:    166.2 lbs. Ethnicity:  White                            Exam Date: 11/29/2017 FRAX* RESULTS:  (version: 3.5) 10-year Probability of Fracture1 Major Osteoporotic Fracture2 Hip Fracture 8.8% 0.9% Population: [place] (Caucasian) Risk Factors: None Based on Femur (Right) Neck BMD 1 -The 10-year probability of fracture may be lower than reported if the patient has received treatment. 2 -Major Osteoporotic Fracture: Clinical Spine, Forearm, Hip or Shoulder *FRAX is a Armed forces logistics/support/administrative offic[employer]radem[employer] of the [employer] Centre for Metabolic Bone Disease, a World Science writerHealth Organization (WHO) [employer]. ASSESSMENT: The probability of a major osteoporotic fracture is 8.8% within the next ten years. The probability of a hip fracture is 0.9% within the next ten years. . Electronically Signed   By: Lupita RaiderJames  Green Jr, M.D.   On: 11/29/2017 14:00   Mm Screening Breast Tomo Bilateral  Result Date: 11/30/2017 CLINICAL DATA:  Screening. EXAM: DIGITAL SCREENING BILATERAL MAMMOGRAM WITH TOMO AND CAD COMPARISON:  Previous exam(s). ACR Breast Density Category c: The breast tissue is  heterogeneously dense, which may obscure small masses. FINDINGS: There are no findings suspicious for malignancy. Images were processed with CAD. IMPRESSION: No mammographic evidence of malignancy. A result letter of this screening mammogram will be mailed directly to the patient. RECOMMENDATION: Screening mammogram in one year. (Code:SM-B-01Y) BI-RADS CATEGORY  1: Negative. Electronically Signed   By: Darl PikesSusan  Turner M.D.   On: 11/30/2017 16:58    Assessment: Patient Active Problem List   Diagnosis Date Noted  . Hyperglycemia 10/19/2017  . Memory change 10/19/2017  . History of wrist fracture 10/19/2017  . Health care maintenance 10/15/2015  . Neck fullness 10/11/2015  . Family history of ovarian cancer 10/11/2015  . Thyroid fullness 04/05/2015  . Environmental allergies 07/03/2013  . Graves disease 09/17/2012  . Lichen planus 09/17/2012    Plan: Patient will undergo surgical management with laparoscopic BSO.   The risks of surgery were discussed in detail with the patient including but not limited to: bleeding which may require transfusion or reoperation; infection which may require antibiotics; injury to surrounding organs which may involve bowel, bladder, ureters ; need for additional procedures including laparoscopy or laparotomy; thromboembolic phenomenon, surgical site problems and other postoperative/anesthesia complications. Likelihood of success in alleviating the patient's condition was discussed. Routine postoperative instructions will be reviewed with the patient and her family in detail after surgery.  The patient concurred with the proposed plan, giving informed written consent for the surgery.  Patient has been NPO since last night she will remain NPO for procedure.  Anesthesia and OR aware. ERAS protocol. To OR when ready.  ----- Ranae Plumber, MD Attending Obstetrician and Gynecologist [facility], Department of OB/GYN [facility] 12/08/2017 7:12  AM

## 2017-12-08 NOTE — Anesthesia Preprocedure Evaluation (Signed)
Anesthesia Evaluation  Patient identified by MRN, date of birth, ID band Patient awake    Reviewed: Allergy & Precautions, H&P , NPO status , Patient's Chart, lab work & pertinent test results  History of Anesthesia Complications (+) history of anesthetic complications  Airway Mallampati: III  TM Distance: >3 FB Neck ROM: full    Dental  (+) Chipped   Pulmonary neg shortness of breath, former smoker,           Cardiovascular Exercise Tolerance: Good (-) angina(-) Past MI and (-) DOE negative cardio ROS       Neuro/Psych negative neurological ROS  negative psych ROS   GI/Hepatic negative GI ROS, Neg liver ROS,   Endo/Other  Hypothyroidism Hyperthyroidism   Renal/GU      Musculoskeletal   Abdominal   Peds  Hematology negative hematology ROS (+)   Anesthesia Other Findings Past Medical History: No date: Complication of anesthesia     Comment:  long term anesthesia effects...months later No date: Fibrocystic breast     Comment:  pt is BRACA neg  No date: Graves disease     Comment:  had iodine treatment 1990's, now hypothyroid-                 2016-2018  No date: Hypothyroidism No date: Laceration of head     Comment:  x2 post falls No date: Lichen planus  Past Surgical History: 2004: ABDOMINAL HYSTERECTOMY 2002: BREAST BIOPSY; Left     Comment:  needle bx 12/28/2015: COLONOSCOPY WITH PROPOFOL; N/A     Comment:  Procedure: COLONOSCOPY WITH PROPOFOL;  Surgeon: Wallace CullensPaul Y               Oh, MD;  [facility];  Service:               Gastroenterology;  Laterality: N/A; 07/22/2017: OPEN REDUCTION INTERNAL FIXATION (ORIF) DISTAL RADIAL  FRACTURE; Left     Comment:  Procedure: LEFT OPEN REDUCTION INTERNAL FIXATION (ORIF)               DISTAL RADIAL FRACTURE AND REPAIR AS INDICATED;  Surgeon:              Bradly Bienenstockrtmann, Fred, MD;  Location: [facility] OR;  Service:               Orthopedics;  Laterality: Left; No  date: VAGINAL DELIVERY     Comment:  had birth center delivery     Reproductive/Obstetrics negative OB ROS                             Anesthesia Physical Anesthesia Plan  ASA: II  Anesthesia Plan: General ETT   Post-op Pain Management:    Induction: Intravenous  PONV Risk Score and Plan: Ondansetron, Dexamethasone and Treatment may vary due to age or medical condition  Airway Management Planned: Oral ETT  Additional Equipment:   Intra-op Plan:   Post-operative Plan: Extubation in OR  Informed Consent: I have reviewed the patients History and Physical, chart, labs and discussed the procedure including the risks, benefits and alternatives for the proposed anesthesia with the patient or authorized representative who has indicated his/her understanding and acceptance.   Dental Advisory Given  Plan Discussed with: Anesthesiologist, CRNA and Surgeon  Anesthesia Plan Comments: (Patient consented for risks of anesthesia including but not limited to:  - adverse reactions to medications - damage to teeth, lips or other oral mucosa -  sore throat or hoarseness - Damage to heart, brain, lungs or loss of life  Patient voiced understanding.)        Anesthesia Quick Evaluation

## 2017-12-08 NOTE — Op Note (Signed)
12/08/2017  PATIENT:  Jodi Stewart  62 y.o. 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 Left ovary and tube 2. Right ovary, possible tube  Findings:   1. Uterus previously removed.  2. Small bowel with adhesions to pelvic floor 3. Cecum adherent to right pelvic side wall, obscuring right ovary, normal appendix. 4. Sigmoid colon adherent to left adnexa 5. Bilateral ureters vermiculating, right ureter without obvious damage.  DISPOSITION OF SPECIMEN:  To pathology  COUNTS: correct x2  COMPLICATIONS: none apparent  PATIENT DISPOSITION:  VS stable to PACU   Indication for surgery: Patient had presented with strong family history of hereditary breast/ovarian cancer syndrome, and was interested in a risk-reducing BSO. Risks of surgery were discussed, as well as NCCN guidelines reviewed.    Procedure: The patient was brought to the OR and identified as Jodi Stewart.  She was given general anesthesia via endotracheal route, positioned supine, and prepped and draped in the usual sterile fashion.  A surgical time-out was called. A foley catheter was placed. An umbilical incision was made, fascia divided bluntly, and using the visiport method, an 11mm trochar was inserted. Opening pressure was . Pneumoperitoneum was created to .  Brief survey of the abdomen was performed and appeared normal. There was a gray film diffusely on the anterior liver, other findings were as above.  Two 5mm trochar were placed in the RLQ and LLQ under  visualization.  The patient was placed in trendelenburg.  The bovie hook was used to transect the filmy adhesions of the cecum and the right pelvic side wall. Cold scissors were used to transect the filmy adhesions of the small bowel to the low pelvic floor. The bovie hook and Ligasure were used to transect the adhesions of the sigmoid colon epiploica and the left adnexa.  Once the pelvis was able to be visualized without obstruction, the left tube and ovary were grasped, retracted medially, and the bovie hook was used to divide the anterior peritoneum and expose both the IP ligament and the ureter retroperitoneally.  The IP was then thrice cauterized with the Ligasure and divided.  The tube and ovary were placed in the pelvic culdesac.    The right ovary was scarred to the pelvic side wall, without obvious inclusion of the fallopian tube.  The ureter was not able to be visualized transperitoneally.  The ovary was grasped and retracted medially, and the anterior peritoneum was divided with the bovie hook.  This exposed the ureter that was running exactly at the level of the ovary, with some adhesions between the two structures.  Blunt dissection was used for ureterolysis.  Once the ureter was isolated, the bovie hook was used to further remove the ovarian complex from the peritoneum, and to further expose the IP ligament.  This was thrice cauterized with the Ligasure and divided, and the underlying peritoneum also divided.  The ureter was visualized vermiculating throughout the procedure with without apparent injury. The pneumoperitoneum was reduced to and the operative sites were hemostatic, then it was returned to .  The Endocatch bag was inserted and the  two ovarian complexes were withdrawn from the abdomen without difficulty.   The 11mm trochar site was then closed using the inlet closure device and 0-vicryl.    The pneumoperitoneum was deflated and remaining trochars removed.  The skin  incisions were reapproximated with 4-0 monocryl.  The skin was then closed with sugical glue.    The patient tolerated the procedure, the sponge, needle, and instrument counts were correct x2, and the patient was brought to PACU extubated, and in a stable condition.  I was present for, and performed this procedure in its entirety.  ----- Ranae Plumberhelsea Jance Siek, MD Attending Obstetrician and Gynecologist [facility], Department of OB/GYN [facility]

## 2017-12-08 NOTE — Transfer of Care (Signed)
Immediate Anesthesia Transfer of Care Note  Patient: Jodi MiniumLucy Campbell Rhoda  Procedure(s) Performed: LAPAROSCOPIC BILATERAL SALPINGO OOPHORECTOMY (Bilateral ) LAPAROSCOPIC LYSIS OF ADHESIONS URETERAL LYSIS  Patient Location: PACU  Anesthesia Type:General  Level of Consciousness: awake and sedated  Airway & Oxygen Therapy: Patient Spontanous Breathing and Patient connected to face mask oxygen  Post-op Assessment: Report given to RN and Post -op Vital signs reviewed and stable  Post vital signs: Reviewed and stable  Last Vitals:  Vitals:   12/08/17 0614  BP: (!) 122/56  Pulse: 63  Resp: 16  Temp: 36.7 C  SpO2: 100%    Last Pain:  Vitals:   12/08/17 0614  TempSrc: Temporal         Complications: No apparent anesthesia complications

## 2017-12-08 NOTE — OR Nursing (Signed)
Dr. Elesa MassedWard in to see pt 1055 am.

## 2017-12-08 NOTE — Anesthesia Post-op Follow-up Note (Signed)
Anesthesia QCDR form completed.        

## 2017-12-11 LAB — SURGICAL PATHOLOGY

## 2018-01-01 ENCOUNTER — Other Ambulatory Visit: Payer: Self-pay | Admitting: Internal Medicine

## 2018-01-22 ENCOUNTER — Other Ambulatory Visit: Payer: Self-pay | Admitting: Internal Medicine

## 2018-02-23 ENCOUNTER — Encounter: Payer: Self-pay | Admitting: Internal Medicine

## 2018-02-23 MED ORDER — TRIAMCINOLONE ACETONIDE 0.1 % EX CREA: 1.0000 "application " | TOPICAL_CREAM | Freq: Two times a day (BID) | CUTANEOUS | 0 refills | Status: DC

## 2018-02-23 NOTE — Telephone Encounter (Signed)
rx sent in for triamcinolone.   

## 2018-03-21 ENCOUNTER — Encounter: Payer: Self-pay | Admitting: Internal Medicine

## 2018-03-21 DIAGNOSIS — E0500 Thyrotoxicosis with diffuse goiter without thyrotoxic crisis or storm: Secondary | ICD-10-CM

## 2018-03-22 NOTE — Telephone Encounter (Signed)
See pts my chart message.  She is requesting a referral to endocrinology.  Has a history of Graves Disease.  On thyroid replacement.  Has been doing well on this.  Requesting endocrinology evaluation for "complete w/up".

## 2018-04-17 LAB — TSH: TSH: 1.57 (ref 0.41–5.90)

## 2018-06-01 ENCOUNTER — Encounter: Payer: Self-pay | Admitting: Internal Medicine

## 2018-06-01 NOTE — Telephone Encounter (Signed)
Usually do not call in prednisone over the phone without seeing pt.  Can work in next week, but probably will be too far out.   Urgent care evaluation

## 2018-06-01 NOTE — Telephone Encounter (Signed)
Patient aware and was very understanding. She will monitor over night and if she feels she needs urgent care she will go to get evaluated, if not she will let us know Monday.

## 2018-07-04 ENCOUNTER — Ambulatory Visit: Payer: 59 | Admitting: Family Medicine

## 2018-07-04 ENCOUNTER — Encounter: Payer: Self-pay | Admitting: Family Medicine

## 2018-07-04 VITALS — BP 108/66 | HR 64 | Temp 98.1°F | Ht 69.0 in | Wt 159.8 lb

## 2018-07-04 DIAGNOSIS — L089 Local infection of the skin and subcutaneous tissue, unspecified: Secondary | ICD-10-CM | POA: Diagnosis not present

## 2018-07-04 MED ORDER — DOXYCYCLINE HYCLATE 100 MG PO TABS: 100.0000 mg | ORAL_TABLET | Freq: Two times a day (BID) | ORAL | 0 refills | Status: DC

## 2018-07-04 NOTE — Assessment & Plan Note (Signed)
With some redness or swelling May have started with poison ivy/bug bite or splinter (no fb now) tx with oral doxycycline bid 10 d  Clean with soap and water  Lightly cover  Watch for inc red/streaking/swelling or any pain  Reassuring exam  Update if not starting to improve in a week or if worsening

## 2018-07-04 NOTE — Progress Notes (Signed)
Subjective:    [name], female    DOB: 1956/08/22, 62 y.o.   MRN: [id number]  HPI  62 yo pt of Dr Roby Lofts here for skin problem on R foot   Went to [place] sat am (wore flip flops) - did a little outdoor work Was around vines/briars  Never got in the water   Noticed this weekend  Noticed blisters on 3rd right toe  Used triamcinolone cream thinking it was poison ivy Now more red and swollen and a pustule   It does itch  Just a little sore from swelling- not now   No fever   Tdap 2016  Patient Active Problem List   Diagnosis Date Noted  . Skin infection 07/04/2018  . Hyperglycemia 10/19/2017  . Memory change 10/19/2017  . History of wrist fracture 10/19/2017  . Health care maintenance 10/15/2015  . Neck fullness 10/11/2015  . Family history of ovarian cancer 10/11/2015  . Thyroid fullness 04/05/2015  . Environmental allergies 07/03/2013  . Graves disease 09/17/2012  . Lichen planus 09/17/2012   Past Medical History:  Diagnosis Date  . Complication of anesthesia    long term anesthesia effects...months later  . Fibrocystic breast    pt is BRACA neg   . Graves disease    had iodine treatment 1990's, now hypothyroid-   2016-2018   . Hypothyroidism   . Laceration of head    x2 post falls  . Lichen planus    Past Surgical History:  Procedure Laterality Date  . ABDOMINAL HYSTERECTOMY  2004  . BREAST BIOPSY Left 2002   needle bx  . COLONOSCOPY WITH PROPOFOL N/A 12/28/2015   Procedure: COLONOSCOPY WITH PROPOFOL;  Surgeon: Wallace Cullens, MD;  Location: [facility] ENDOSCOPY;  Service: Gastroenterology;  Laterality: N/A;  . LAPAROSCOPIC BILATERAL SALPINGO OOPHERECTOMY Bilateral 12/08/2017   Procedure: LAPAROSCOPIC BILATERAL SALPINGO OOPHORECTOMY;  Surgeon: Ward, Elenora Fender, MD;  Location: [facility] ORS;  Service: Gynecology;  Laterality: Bilateral;  . LAPAROSCOPIC LYSIS OF ADHESIONS  12/08/2017   Procedure: LAPAROSCOPIC LYSIS OF ADHESIONS;  Surgeon: Ward,  Elenora Fender, MD;  Location: [facility] ORS;  Service: Gynecology;;  . LYSIS OF ADHESION  12/08/2017   Procedure: URETERAL LYSIS;  Surgeon: Ward, Elenora Fender, MD;  Location: [facility] ORS;  Service: Gynecology;;  . OPEN REDUCTION INTERNAL FIXATION (ORIF) DISTAL RADIAL FRACTURE Left 07/22/2017   Procedure: LEFT OPEN REDUCTION INTERNAL FIXATION (ORIF) DISTAL RADIAL FRACTURE AND REPAIR AS INDICATED;  Surgeon: Bradly Bienenstock, MD;  Location: [facility] OR;  Service: Orthopedics;  Laterality: Left;  [facility] VAGINAL DELIVERY     had birth center delivery   Social History   Tobacco Use  . Smoking status: Former Smoker    Types: Cigarettes    Last attempt to quit: 08/16/1985    Years since quitting: 32.9  . Smokeless tobacco: Never Used  . Tobacco comment: smoked from age [age]  Substance Use Topics  . Alcohol use: Yes    Alcohol/week: 0.0 standard drinks    Comment: daily glass of wine  . Drug use: No   Family History  Problem Relation Age of Onset  . Goiter Mother   . Mental illness Mother   . Ovarian cancer Mother        died 84  . Breast cancer Maternal Aunt 60  . Breast cancer Maternal Grandmother 80  . Goiter Sister   . Breast cancer Sister 60  . Cancer Maternal Aunt 92       Renal Cancer  .  Colon cancer Neg Hx    Allergies  Allergen Reactions  . Tioconazole Itching  . Aspirin Rash    Welts- childhood allergy  . Monistat [Miconazole] Itching and Other (See Comments)    Itching/discomfort  . Penicillins Rash    Has patient had a PCN reaction causing immediate rash, facial/tongue/throat swelling, SOB or lightheadedness with hypotension: Unknown Has patient had a PCN reaction causing severe rash involving mucus membranes or skin necrosis: Unknown Has patient had a PCN reaction that required hospitalization: No Has patient had a PCN reaction occurring within the last 10 years: No If all of the above answers are "NO", then may proceed with Cephalosporin use.  Childhood allergy   Current Outpatient  Medications on File Prior to Visit  Medication Sig Dispense Refill  . Alpha-Lipoic Acid 600 MG CAPS Take 600 mg by mouth every evening.    . Docosahexaenoic Acid (DHA PO) Take 1,000 mg by mouth daily with supper. 500 MG PER CAPSULE    . ibuprofen (ADVIL,MOTRIN) 800 MG tablet Take 1 tablet (800 mg total) by mouth every 6 (six) hours. 45 tablet 0  . levothyroxine (SYNTHROID, LEVOTHROID) 50 MCG tablet TAKE ONE TABLET BY MOUTH EVERY DAY BEFORE BREAKFAST. 30 tablet 5  . RESVERATROL PO Take 200 mg by mouth daily with supper.    . triamcinolone cream (KENALOG) 0.1 % Apply 1 application topically 2 (two) times daily. Avoid breast, face and genitals.  Do not use for more than 7-10 days in same place. 30 g 0  . vitamin B-12 (CYANOCOBALAMIN) 500 MCG tablet Take 500 mcg by mouth daily with supper.     No current facility-administered medications on file prior to visit.      Review of Systems  Constitutional: Negative for activity change, appetite change, fatigue, fever and unexpected weight change.  HENT: Negative for congestion, ear pain, rhinorrhea, sinus pressure and sore throat.   Eyes: Negative for pain, redness and visual disturbance.  Respiratory: Negative for cough, shortness of breath and wheezing.   Cardiovascular: Negative for chest pain and palpitations.  Gastrointestinal: Negative for abdominal pain, blood in stool, constipation and diarrhea.  Endocrine: Negative for polydipsia and polyuria.  Genitourinary: Negative for dysuria, frequency and urgency.  Musculoskeletal: Negative for arthralgias, back pain and myalgias.  Skin: Positive for color change and rash. Negative for pallor.  Allergic/Immunologic: Negative for environmental allergies.  Neurological: Negative for dizziness, syncope and headaches.  Hematological: Negative for adenopathy. Does not bruise/bleed easily.  Psychiatric/Behavioral: Negative for decreased concentration and dysphoric mood. The patient is not nervous/anxious.          Objective:   Physical Exam  Constitutional: She appears well-developed and well-nourished. No distress.  HENT:  Head: Normocephalic and atraumatic.  Eyes: Pupils are equal, round, and reactive to light. Conjunctivae and EOM are normal.  Cardiovascular: Normal rate and intact distal pulses.  Pulmonary/Chest: No respiratory distress.  Musculoskeletal: She exhibits no tenderness or deformity.  Neurological: She is alert. No sensory deficit. She exhibits normal muscle tone.  Skin:  R 3rd toe- redness over distal 1/2 - with a few vesicles and a former ?pustule  Minimal swelling Now peeling  nontender Nl rom toe/foot and nl perf/sensation  Nl appearing toe nail            Assessment & Plan:   Problem List Items Addressed This Visit      Musculoskeletal and Integument   Skin infection - Primary    With some redness or swelling May have  started with poison ivy/bug bite or splinter (no fb now) tx with oral doxycycline bid 10 d  Clean with soap and water  Lightly cover  Watch for inc red/streaking/swelling or any pain  Reassuring exam  Update if not starting to improve in a week or if worsening

## 2018-07-04 NOTE — Patient Instructions (Signed)
You may have an infection of skin on toe (or allergic reaction or both)  Keep clean with soap and water  Take the doxycycline as directed  Cover loosely to protect   If increase in swelling/redness/pain - or streaking of redness please let us know   Elevate your foot when you can   Update if not starting to improve in a week or if worsening

## 2018-07-11 ENCOUNTER — Ambulatory Visit: Payer: 59 | Admitting: Family

## 2018-07-11 ENCOUNTER — Encounter: Payer: Self-pay | Admitting: Family

## 2018-07-11 VITALS — BP 118/76 | HR 64 | Temp 98.1°F | Resp 16 | Ht 69.0 in | Wt 162.4 lb

## 2018-07-11 DIAGNOSIS — L089 Local infection of the skin and subcutaneous tissue, unspecified: Secondary | ICD-10-CM | POA: Diagnosis not present

## 2018-07-11 MED ORDER — MUPIROCIN CALCIUM 2 % EX CREA: 1.0000 "application " | TOPICAL_CREAM | Freq: Three times a day (TID) | CUTANEOUS | 0 refills | Status: DC

## 2018-07-11 NOTE — Progress Notes (Signed)
Subjective:    [name], female    DOB: [birth year], 62 y.o.   MRN: [id number]  CC: Jodi Stewart is a 62 y.o. female who presents today for an acute visit.    HPI: CC: toe laceration and discharge from right 3rd toe from [employer] , improved. Not painful. Itches. Purulent yellow discharge.   Not  Spreading. Otherwise feels well, no fever, N, vomiting   No h/o mrsa, DM  Wearing flip flops and working in yard. Did not get in water. Started as blister.  Seen 7 days ago by Dr Milinda Antis, started on doxcycline for a week, swelling has improved. Wound has not changed.     Unsure if infection or allergic reaction from poison ivy from being outside     HISTORY:  Past Medical History:  Diagnosis Date  . Complication of anesthesia    long term anesthesia effects...months later  . Fibrocystic breast    pt is BRACA neg   . Graves disease    had iodine treatment 1990's, now hypothyroid-   2016-2018   . Hypothyroidism   . Laceration of head    x2 post falls  . Lichen planus    Past Surgical History:  Procedure Laterality Date  . ABDOMINAL HYSTERECTOMY  2004  . BREAST BIOPSY Left 2002   needle bx  . COLONOSCOPY WITH PROPOFOL N/A 12/28/2015   Procedure: COLONOSCOPY WITH PROPOFOL;  Surgeon: Wallace Cullens, MD;  Location: [facility] ENDOSCOPY;  Service: Gastroenterology;  Laterality: N/A;  . LAPAROSCOPIC BILATERAL SALPINGO OOPHERECTOMY Bilateral 12/08/2017   Procedure: LAPAROSCOPIC BILATERAL SALPINGO OOPHORECTOMY;  Surgeon: Ward, Elenora Fender, MD;  Location: [facility] ORS;  Service: Gynecology;  Laterality: Bilateral;  . LAPAROSCOPIC LYSIS OF ADHESIONS  12/08/2017   Procedure: LAPAROSCOPIC LYSIS OF ADHESIONS;  Surgeon: Ward, Elenora Fender, MD;  Location: [facility] ORS;  Service: Gynecology;;  . LYSIS OF ADHESION  12/08/2017   Procedure: URETERAL LYSIS;  Surgeon: Ward, Elenora Fender, MD;  Location: [facility] ORS;  Service: Gynecology;;  . OPEN REDUCTION INTERNAL FIXATION (ORIF)  DISTAL RADIAL FRACTURE Left 07/22/2017   Procedure: LEFT OPEN REDUCTION INTERNAL FIXATION (ORIF) DISTAL RADIAL FRACTURE AND REPAIR AS INDICATED;  Surgeon: Bradly Bienenstock, MD;  Location: [facility] OR;  Service: Orthopedics;  Laterality: Left;  [facility] VAGINAL DELIVERY     had birth center delivery   Family History  Problem Relation Age of Onset  . Goiter Mother   . Mental illness Mother   . Ovarian cancer Mother        died 37  . Breast cancer Maternal Aunt 60  . Breast cancer Maternal Grandmother 80  . Goiter Sister   . Breast cancer Sister 82  . Cancer Maternal Aunt 75       Renal Cancer  . Colon cancer Neg Hx     Allergies: Tioconazole; Aspirin; Monistat [miconazole]; and Penicillins Current Outpatient Medications on File Prior to Visit  Medication Sig Dispense Refill  . Alpha-Lipoic Acid 600 MG CAPS Take 600 mg by mouth every evening.    . Docosahexaenoic Acid (DHA PO) Take 1,000 mg by mouth daily with supper. 500 MG PER CAPSULE    . doxycycline (VIBRA-TABS) 100 MG tablet Take 1 tablet (100 mg total) by mouth 2 (two) times daily. 20 tablet 0  . ibuprofen (ADVIL,MOTRIN) 800 MG tablet Take 1 tablet (800 mg total) by mouth every 6 (six) hours. 45 tablet 0  . levothyroxine (SYNTHROID, LEVOTHROID) 50 MCG tablet TAKE ONE TABLET BY MOUTH EVERY  DAY BEFORE BREAKFAST. (Patient taking differently: No sig reported) 30 tablet 5  . RESVERATROL PO Take 200 mg by mouth daily with supper.    . triamcinolone cream (KENALOG) 0.1 % Apply 1 application topically 2 (two) times daily. Avoid breast, face and genitals.  Do not use for more than 7-10 days in same place. 30 g 0  . vitamin B-12 (CYANOCOBALAMIN) 500 MCG tablet Take 500 mcg by mouth daily with supper.     No current facility-administered medications on file prior to visit.     Social History   Tobacco Use  . Smoking status: Former Smoker    Types: Cigarettes    Last attempt to quit: 08/16/1985    Years since quitting: 32.9  . Smokeless tobacco:  Never Used  . Tobacco comment: smoked from age [age]  Substance Use Topics  . Alcohol use: Yes    Alcohol/week: 0.0 standard drinks    Comment: daily glass of wine  . Drug use: No    Review of Systems  Constitutional: Negative for chills and fever.  Respiratory: Negative for cough.   Cardiovascular: Negative for chest pain and palpitations.  Gastrointestinal: Negative for nausea and vomiting.  Skin: Positive for rash and wound.      Objective:    BP 118/76 (BP Location: Left Arm, Patient Position: Sitting, Cuff Size: Normal)   Pulse 64   Temp 98.1 F (36.7 C) (Oral)   Resp 16   Ht 5\' 9"  (1.753 m)   Wt 162 lb 6 oz (73.7 kg)   LMP 09/18/2003   SpO2 99%   BMI 23.98 kg/m    Physical Exam  Constitutional: She appears well-developed and well-nourished.  Eyes: Conjunctivae are normal.  Cardiovascular: Normal rate, regular rhythm, normal heart sounds and normal pulses.  Pulmonary/Chest: Effort normal and breath sounds normal. She has no wheezes. She has no rhonchi. She has no rales.  Neurological: She is alert.  Skin: Skin is warm and dry.  Right 3rd metatarsal localized erythema, with clear to yellow discharge. Non tender. No increase of warmth or streaking.   Psychiatric: She has a normal mood and affect. Her speech is normal and behavior is normal. Thought content normal.  Vitals reviewed.      Assessment & Plan:  Patient is well-appearing, she is nontoxic in appearance.  She appears to be medically improving although slowly on doxycycline.  We discussed at length we will continue doxycycline and await wound culture to see if changing antibiotic would be appropriate.  At this gesture, we jointly agreed that we would start topical Bactroban as well and continue close vigilance.  Patient will let me know if she does not continue to see improvement, and if she feels that we need to extend doxycycline after she completes.    I am having Jodi Stewart start on mupirocin  cream. I am also having her maintain her RESVERATROL PO, Alpha-Lipoic Acid, Docosahexaenoic Acid (DHA PO), vitamin B-12, ibuprofen, levothyroxine, triamcinolone cream, and doxycycline.   Meds ordered this encounter  Medications  . mupirocin cream (BACTROBAN) 2 %    Sig: Apply 1 application topically 3 (three) times daily.    Dispense:  15 g    Refill:  0    Order Specific Question:   Supervising Provider    Answer:   Sherlene Shams [[id number]]    Return precautions given.   Risks, benefits, and alternatives of the medications and treatment plan prescribed today were discussed, and patient expressed understanding.  Education regarding symptom management and diagnosis given to patient on AVS.  Continue to follow with Dale Goodnight, MD for routine health maintenance.   Jodi Stewart and I agreed with plan.   Jodi Plowman, FNP

## 2018-07-11 NOTE — Patient Instructions (Addendum)
Since we have had some improvement, lets continue the doxycycline. Let me know at the end of the course if we should extend  Start bactroban  Ensure to take probiotics while on antibiotics and also for 2 weeks after completion. It is important to re-colonize the gut with good bacteria and also to prevent any diarrheal infections associated with antibiotic use.   Close vigilance.

## 2018-07-14 LAB — WOUND CULTURE
GRAM STAIN:: NONE SEEN
MICRO NUMBER: 91243984
RESULT:: NO GROWTH
SPECIMEN QUALITY:: ADEQUATE

## 2018-07-28 ENCOUNTER — Encounter: Payer: Self-pay | Admitting: Family

## 2018-07-29 ENCOUNTER — Emergency Department: Payer: 59

## 2018-07-29 ENCOUNTER — Other Ambulatory Visit: Payer: Self-pay

## 2018-07-29 ENCOUNTER — Observation Stay
Admission: EM | Admit: 2018-07-29 | Discharge: 2018-07-30 | Disposition: A | Discharge status: Home / Self Care | Payer: 59 | Attending: Internal Medicine | Admitting: Internal Medicine

## 2018-07-29 ENCOUNTER — Encounter: Payer: Self-pay | Admitting: Family

## 2018-07-29 DIAGNOSIS — Z880 Allergy status to penicillin: Secondary | ICD-10-CM | POA: Insufficient documentation

## 2018-07-29 DIAGNOSIS — E039 Hypothyroidism, unspecified: Secondary | ICD-10-CM | POA: Insufficient documentation

## 2018-07-29 DIAGNOSIS — Z87891 Personal history of nicotine dependence: Secondary | ICD-10-CM | POA: Insufficient documentation

## 2018-07-29 DIAGNOSIS — I891 Lymphangitis: Secondary | ICD-10-CM

## 2018-07-29 DIAGNOSIS — L03115 Cellulitis of right lower limb: Secondary | ICD-10-CM | POA: Diagnosis present

## 2018-07-29 DIAGNOSIS — L03031 Cellulitis of right toe: Principal | ICD-10-CM | POA: Insufficient documentation

## 2018-07-29 DIAGNOSIS — Z79890 Hormone replacement therapy (postmenopausal): Secondary | ICD-10-CM | POA: Diagnosis not present

## 2018-07-29 DIAGNOSIS — Z883 Allergy status to other anti-infective agents status: Secondary | ICD-10-CM | POA: Insufficient documentation

## 2018-07-29 LAB — CBC WITH DIFFERENTIAL/PLATELET
Abs Immature Granulocytes: 0.02 10*3/uL (ref 0.00–0.07)
BASOS ABS: 0.1 10*3/uL (ref 0.0–0.1)
BASOS PCT: 1 %
EOS ABS: 0 10*3/uL (ref 0.0–0.5)
Eosinophils Relative: 1 %
HCT: 40.5 % (ref 36.0–46.0)
Hemoglobin: 13 g/dL (ref 12.0–15.0)
Immature Granulocytes: 0 %
Lymphocytes Relative: 22 %
Lymphs Abs: 1.1 10*3/uL (ref 0.7–4.0)
MCH: 29.3 pg (ref 26.0–34.0)
MCHC: 32.1 g/dL (ref 30.0–36.0)
MCV: 91.4 fL (ref 80.0–100.0)
Monocytes Absolute: 0.5 10*3/uL (ref 0.1–1.0)
Monocytes Relative: 10 %
NRBC: 0 % (ref 0.0–0.2)
Neutro Abs: 3.4 10*3/uL (ref 1.7–7.7)
Neutrophils Relative %: 66 %
PLATELETS: 171 10*3/uL (ref 150–400)
RBC: 4.43 MIL/uL (ref 3.87–5.11)
RDW: 12.6 % (ref 11.5–15.5)
WBC: 5.1 10*3/uL (ref 4.0–10.5)

## 2018-07-29 LAB — COMPREHENSIVE METABOLIC PANEL
ALK PHOS: 47 U/L (ref 38–126)
ALT: 24 U/L (ref 0–44)
ANION GAP: 9 (ref 5–15)
AST: 28 U/L (ref 15–41)
Albumin: 4.2 g/dL (ref 3.5–5.0)
BILIRUBIN TOTAL: 0.6 mg/dL (ref 0.3–1.2)
BUN: 11 mg/dL (ref 8–23)
CALCIUM: 9 mg/dL (ref 8.9–10.3)
CO2: 26 mmol/L (ref 22–32)
Chloride: 104 mmol/L (ref 98–111)
Creatinine, Ser: 0.87 mg/dL (ref 0.44–1.00)
Glucose, Bld: 108 mg/dL — ABNORMAL HIGH (ref 70–99)
Potassium: 3.7 mmol/L (ref 3.5–5.1)
Sodium: 139 mmol/L (ref 135–145)
TOTAL PROTEIN: 7.7 g/dL (ref 6.5–8.1)

## 2018-07-29 LAB — LACTIC ACID, PLASMA: Lactic Acid, Venous: 0.8 mmol/L (ref 0.5–1.9)

## 2018-07-29 MED ORDER — ONDANSETRON HCL 4 MG/2ML IJ SOLN: 4.0000 mg | Freq: Four times a day (QID) | INTRAMUSCULAR | Status: DC | PRN

## 2018-07-29 MED ORDER — VANCOMYCIN HCL IN DEXTROSE 1-5 GM/200ML-% IV SOLN
1000.0000 mg | Freq: Two times a day (BID) | INTRAVENOUS | Status: DC
  Administered 2018-07-30: 1000 mg via INTRAVENOUS
  Filled 2018-07-29 (×4): qty 200

## 2018-07-29 MED ORDER — ONDANSETRON HCL 4 MG PO TABS: 4.0000 mg | ORAL_TABLET | Freq: Four times a day (QID) | ORAL | Status: DC | PRN

## 2018-07-29 MED ORDER — ACETAMINOPHEN 650 MG RE SUPP: 650.0000 mg | Freq: Four times a day (QID) | RECTAL | Status: DC | PRN

## 2018-07-29 MED ORDER — IBUPROFEN 400 MG PO TABS: 400.0000 mg | ORAL_TABLET | Freq: Four times a day (QID) | ORAL | Status: DC | PRN

## 2018-07-29 MED ORDER — ENOXAPARIN SODIUM 40 MG/0.4ML ~~LOC~~ SOLN: 40.0000 mg | SUBCUTANEOUS | Status: DC

## 2018-07-29 MED ORDER — ACETAMINOPHEN 325 MG PO TABS: 650.0000 mg | ORAL_TABLET | Freq: Four times a day (QID) | ORAL | Status: DC | PRN

## 2018-07-29 MED ORDER — VANCOMYCIN HCL IN DEXTROSE 1-5 GM/200ML-% IV SOLN
1000.0000 mg | Freq: Once | INTRAVENOUS | Status: AC
  Administered 2018-07-29: 1000 mg via INTRAVENOUS
  Filled 2018-07-29: qty 200

## 2018-07-29 MED ORDER — ALBUTEROL SULFATE (2.5 MG/3ML) 0.083% IN NEBU: 2.5000 mg | INHALATION_SOLUTION | RESPIRATORY_TRACT | Status: DC | PRN

## 2018-07-29 MED ORDER — POLYETHYLENE GLYCOL 3350 17 G PO PACK: 17.0000 g | PACK | Freq: Every day | ORAL | Status: DC | PRN

## 2018-07-29 NOTE — ED Triage Notes (Signed)
Pt saw [facility] today for cellulitis of 3rd toe. States they told her to come to ED if got worse. States noticed that redness and swelling returned today. Sent for IV antibiotics d/t failing PO antibiotics (doxycycline). States also noticed redness went up R leg today. Received IM of 600mg  clindamycin at [facility].   A&O, ambulatory.

## 2018-07-29 NOTE — H&P (Signed)
[facility] - [place] at [facility]   PATIENT NAME: Jodi Stewart    MR#:  [id number]  DATE OF BIRTH:  12-29-1955  DATE OF ADMISSION:  07/29/2018  PRIMARY CARE PHYSICIAN: Dale [place] Morris, MD   REQUESTING/REFERRING PHYSICIAN: Dr. Mayford Knife  CHIEF COMPLAINT:   Chief Complaint  Patient presents with  . Cellulitis    HISTORY OF PRESENT ILLNESS:  Jodi Stewart  is a 62 y.o. female with a known history of hypothyroidism presents to the emergency room due to quickly worsening redness from her third toe cellulitis extending up to the knee.  Patient had this problem for 3 weeks now.  Initially saw her primary care physician and was started on doxycycline.  The course were extended to 10 days.  Symptoms improved well.  Due to reoccurrence patient saw physician at urgent clinic was given a dose of clindamycin and sent home.  After leaving from there in the morning she noticed the redness quickly worsening along with warmth and pain and presented to the emergency room.  Here she has no fever and normal WBC.  But due to extensive redness and worsening along with failed outpatient treatment patient is being admitted to the hospital.  Started on vancomycin in the emergency room.  PAST MEDICAL HISTORY:   Past Medical History:  Diagnosis Date  . Complication of anesthesia    long term anesthesia effects...months later  . Fibrocystic breast    pt is BRACA neg   . Graves disease    had iodine treatment 1990's, now hypothyroid-   2016-2018   . Hypothyroidism   . Laceration of head    x2 post falls  . Lichen planus     PAST SURGICAL HISTORY:   Past Surgical History:  Procedure Laterality Date  . ABDOMINAL HYSTERECTOMY  2004  . BREAST BIOPSY Left 2002   needle bx  . COLONOSCOPY WITH PROPOFOL N/A 12/28/2015   Procedure: COLONOSCOPY WITH PROPOFOL;  Surgeon: Wallace Cullens, MD;  Location: [facility] ENDOSCOPY;  Service: Gastroenterology;  Laterality: N/A;  . LAPAROSCOPIC BILATERAL  SALPINGO OOPHERECTOMY Bilateral 12/08/2017   Procedure: LAPAROSCOPIC BILATERAL SALPINGO OOPHORECTOMY;  Surgeon: Ward, Elenora Fender, MD;  Location: [facility] ORS;  Service: Gynecology;  Laterality: Bilateral;  . LAPAROSCOPIC LYSIS OF ADHESIONS  12/08/2017   Procedure: LAPAROSCOPIC LYSIS OF ADHESIONS;  Surgeon: Ward, Elenora Fender, MD;  Location: [facility] ORS;  Service: Gynecology;;  . LYSIS OF ADHESION  12/08/2017   Procedure: URETERAL LYSIS;  Surgeon: Ward, Elenora Fender, MD;  Location: [facility] ORS;  Service: Gynecology;;  . OPEN REDUCTION INTERNAL FIXATION (ORIF) DISTAL RADIAL FRACTURE Left 07/22/2017   Procedure: LEFT OPEN REDUCTION INTERNAL FIXATION (ORIF) DISTAL RADIAL FRACTURE AND REPAIR AS INDICATED;  Surgeon: Bradly Bienenstock, MD;  Location: [facility] OR;  Service: Orthopedics;  Laterality: Left;  Mar[facility] Kitchen VAGINAL DELIVERY     had birth center delivery    SOCIAL HISTORY:   Social History   Tobacco Use  . Smoking status: Former Smoker    Types: Cigarettes    Last attempt to quit: 08/16/1985    Years since quitting: 32.9  . Smokeless tobacco: Never Used  . Tobacco comment: smoked from age [age]  Substance Use Topics  . Alcohol use: Yes    Alcohol/week: 0.0 standard drinks    Comment: daily glass of wine    FAMILY HISTORY:   Family History  Problem Relation Age of Onset  . Goiter Mother   . Mental illness Mother   . Ovarian cancer Mother  died 68  . Breast cancer Maternal Aunt 60  . Breast cancer Maternal Grandmother 80  . Goiter Sister   . Breast cancer Sister 66  . Cancer Maternal Aunt 9       Renal Cancer  . Colon cancer Neg Hx     DRUG ALLERGIES:   Allergies  Allergen Reactions  . Tioconazole Itching  . Aspirin Rash    Welts- childhood allergy  . Monistat [Miconazole] Itching and Other (See Comments)    Itching/discomfort  . Penicillins Rash    Has patient had a PCN reaction causing immediate rash, facial/tongue/throat swelling, SOB or lightheadedness with hypotension: Unknown Has  patient had a PCN reaction causing severe rash involving mucus membranes or skin necrosis: Unknown Has patient had a PCN reaction that required hospitalization: No Has patient had a PCN reaction occurring within the last 10 years: No If all of the above answers are "NO", then may proceed with Cephalosporin use.  Childhood allergy    REVIEW OF SYSTEMS:   Review of Systems  Constitutional: Positive for malaise/fatigue. Negative for chills, fever and weight loss.  HENT: Negative for hearing loss and nosebleeds.   Eyes: Negative for blurred vision, double vision and pain.  Respiratory: Negative for cough, hemoptysis, sputum production, shortness of breath and wheezing.   Cardiovascular: Negative for chest pain, palpitations, orthopnea and leg swelling.  Gastrointestinal: Negative for abdominal pain, constipation, diarrhea, nausea and vomiting.  Genitourinary: Negative for dysuria and hematuria.  Musculoskeletal: Positive for joint pain. Negative for back pain, falls and myalgias.  Skin: Positive for rash.  Neurological: Negative for dizziness, tremors, sensory change, speech change, focal weakness, seizures and headaches.  Endo/Heme/Allergies: Does not bruise/bleed easily.  Psychiatric/Behavioral: Negative for depression and memory loss. The patient is not nervous/anxious.     MEDICATIONS AT HOME:   Prior to Admission medications   Medication Sig Start Date End Date Taking? Authorizing Provider  Alpha-Lipoic Acid 600 MG CAPS Take 600 mg by mouth every evening.    [provider]  Docosahexaenoic Acid (DHA PO) Take 1,000 mg by mouth daily with supper. 500 MG PER CAPSULE    [provider]  doxycycline (VIBRA-TABS) 100 MG tablet Take 1 tablet (100 mg total) by mouth 2 (two) times daily. 07/04/18   Tower, Audrie Gallus, MD  ibuprofen (ADVIL,MOTRIN) 800 MG tablet Take 1 tablet (800 mg total) by mouth every 6 (six) hours. 12/08/17   Ward, Elenora Fender, MD  levothyroxine (SYNTHROID,  LEVOTHROID) 50 MCG tablet TAKE ONE TABLET BY MOUTH EVERY DAY BEFORE BREAKFAST. Patient taking differently: No sig reported 01/23/18   Dale Biddle, MD  mupirocin cream (BACTROBAN) 2 % Apply 1 application topically 3 (three) times daily. 07/11/18   Allegra Grana, FNP  RESVERATROL PO Take 200 mg by mouth daily with supper.    [provider]  triamcinolone cream (KENALOG) 0.1 % Apply 1 application topically 2 (two) times daily. Avoid breast, face and genitals.  Do not use for more than 7-10 days in same place. 02/23/18   Dale Angleton, MD  vitamin B-12 (CYANOCOBALAMIN) 500 MCG tablet Take 500 mcg by mouth daily with supper.    [provider]     VITAL SIGNS:  Blood pressure (!) 143/85, pulse 78, temperature 98.8 F (37.1 C), temperature source Oral, resp. rate 16, weight 72.6 kg, last menstrual period 09/18/2003, SpO2 98 %.  PHYSICAL EXAMINATION:  Physical Exam  GENERAL:  62 y.o.-year-old patient lying in the bed with no acute distress.  EYES: Pupils equal, round, reactive to light and accommodation. No scleral icterus. Extraocular muscles intact.  HEENT: Head atraumatic, normocephalic. Oropharynx and nasopharynx clear. No oropharyngeal erythema, moist oral mucosa  NECK:  Supple, no jugular venous distention. No thyroid enlargement, no tenderness.  LUNGS: Normal breath sounds bilaterally, no wheezing, rales, rhonchi. No use of accessory muscles of respiration.  CARDIOVASCULAR: S1, S2 normal. No murmurs, rubs, or gallops.  ABDOMEN: Soft, nontender, nondistended. Bowel sounds present. No organomegaly or mass.  EXTREMITIES: No pedal edema, cyanosis, or clubbing. + 2 pedal & radial pulses b/l.   NEUROLOGIC: Cranial nerves II through XII are intact. No focal Motor or sensory deficits appreciated b/l PSYCHIATRIC: The patient is alert and oriented x 3. Good affect.  SKIN: Redness and swelling of right third toe with redness extending up the dorsum of the foot and anterior  leg up to the knee  LABORATORY PANEL:   CBC Recent Labs  Lab 07/29/18 1537  WBC 5.1  HGB 13.0  HCT 40.5  PLT 171   ------------------------------------------------------------------------------------------------------------------  Chemistries  Recent Labs  Lab 07/29/18 1537  NA 139  K 3.7  CL 104  CO2 26  GLUCOSE 108*  BUN 11  CREATININE 0.87  CALCIUM 9.0  AST 28  ALT 24  ALKPHOS 47  BILITOT 0.6   ------------------------------------------------------------------------------------------------------------------  Cardiac Enzymes No results for input(s): TROPONINI in the last 168 hours. ------------------------------------------------------------------------------------------------------------------  RADIOLOGY:  Dg Foot Complete Right  Result Date: 07/29/2018 CLINICAL DATA:  Erythema and swelling in the right foot. No reported injury. EXAM: RIGHT FOOT COMPLETE - 3+ VIEW COMPARISON:  None. FINDINGS: Mild soft tissue swelling in the dorsal right foot. No fracture or dislocation. No suspicious focal osseous lesion. Mild first metatarsal-phalangeal joint osteoarthritis. No osseous erosions or periosteal reaction. No radiopaque foreign body. No appreciable soft tissue gas. IMPRESSION: Mild dorsal right foot soft tissue swelling. No specific radiographic findings of osteomyelitis. Mild first MTP joint osteoarthritis. Electronically Signed   By: Delbert Phenix M.D.   On: 07/29/2018 18:29     IMPRESSION AND PLAN:   *Right third toe cellulitis extending up into the leg. Recent treatment with doxycycline for 10 days and has reoccurred.  Will admit for IV vancomycin.  No fever.  Normal WBC.  Has quickly worsening redness of the leg today.  Will need inpatient treatment. Monitor for worsening, fever  *Hypothyroidism.  Continue replacement from home.  DVT prophylaxis with Lovenox  All the records are reviewed and case discussed with ED provider. Management plans discussed with  the patient, family and they are in agreement.  CODE STATUS: FULL CODE  TOTAL TIME TAKING CARE OF THIS PATIENT: 40 minutes.   Molinda Bailiff Desmond Tufano M.D on 07/29/2018 at 8:00 PM  Between 7am to 6pm - Pager - [phone]  After 6pm go to [email or web address] - password [email or web address] [facility]  [facility] [place] [facility]  Office  [phone]  CC: Primary care physician; Dale Vernon, MD  Note: This dictation was prepared with Dragon dictation along with smaller phrase technology. Any transcriptional errors that result from this process are unintentional.

## 2018-07-29 NOTE — Progress Notes (Signed)
Pharmacy Antibiotic Note  Jodi Stewart is a 62 y.o. female admitted on 07/29/2018 with wound infection.  Pharmacy has been consulted for Vancomycin dosing.  Plan:  CrCl = 71 ml/min Ke = 0.063 hr-1 T1/2 = 11 hrs Vd = 50.8 L   Vancomycin 1 gm IV X 1 given in ED on 11/3 @ 19:00. Vancomycin 1 gm IV Q12H ordered to start on 11/4 @ 0100, ~ 6 hrs after 1st dose (stacked dosing). This pt will reach Css by 11/6 @ 0200 Will draw 1st trough on 11/6 @ 00:30, which will be very close to Css.   Weight: 160 lb (72.6 kg)  Temp (24hrs), Avg:98.8 F (37.1 C), Min:98.8 F (37.1 C), Max:98.8 F (37.1 C)  Recent Labs  Lab 07/29/18 1537 07/29/18 1847  WBC 5.1  --   CREATININE 0.87  --   LATICACIDVEN  --  0.8    Estimated Creatinine Clearance: 71 mL/min (by C-G formula based on SCr of 0.87 mg/dL).    Allergies  Allergen Reactions  . Tioconazole Itching  . Aspirin Rash    Welts- childhood allergy  . Monistat [Miconazole] Itching and Other (See Comments)    Itching/discomfort  . Penicillins Rash    Has patient had a PCN reaction causing immediate rash, facial/tongue/throat swelling, SOB or lightheadedness with hypotension: Unknown Has patient had a PCN reaction causing severe rash involving mucus membranes or skin necrosis: Unknown Has patient had a PCN reaction that required hospitalization: No Has patient had a PCN reaction occurring within the last 10 years: No If all of the above answers are "NO", then may proceed with Cephalosporin use.  Childhood allergy    Antimicrobials this admission:   >>    >>   Dose adjustments this admission:   Microbiology results:  BCx:   UCx:    Sputum:    MRSA PCR:   Thank you for allowing pharmacy to be a part of this patient's care.  Buddie Marston D 07/29/2018 8:31 PM

## 2018-07-29 NOTE — ED Provider Notes (Signed)
[facility] Emergency Department Provider Note       Time seen: ----------------------------------------- 6:25 PM on 07/29/2018 -----------------------------------------   I have reviewed the triage vital signs and the nursing notes.  HISTORY   Chief Complaint Cellulitis    HPI Jodi Stewart is a 62 y.o. female with a history of Graves' disease who presents to the ED for possible cellulitis of the third toe on the right foot.  [facility]  They told her to come to the ER if she got worse.  She did have shot of IM antibiotics.  She noticed the redness and swelling returned today.  She has completed outpatient treatment with doxycycline.  Noticed the redness has gone up her right leg.  Past Medical History:  Diagnosis Date  . Complication of anesthesia    long term anesthesia effects...months later  . Fibrocystic breast    pt is BRACA neg   . Graves disease    had iodine treatment 1990's, now hypothyroid-   2016-2018   . Hypothyroidism   . Laceration of head    x2 post falls  . Lichen planus     Patient Active Problem List   Diagnosis Date Noted  . Skin infection 07/04/2018  . Hyperglycemia 10/19/2017  . Memory change 10/19/2017  . History of wrist fracture 10/19/2017  . Health care maintenance 10/15/2015  . Neck fullness 10/11/2015  . Family history of ovarian cancer 10/11/2015  . Thyroid fullness 04/05/2015  . Environmental allergies 07/03/2013  . Graves disease 09/17/2012  . Lichen planus 09/17/2012    Past Surgical History:  Procedure Laterality Date  . ABDOMINAL HYSTERECTOMY  2004  . BREAST BIOPSY Left 2002   needle bx  . COLONOSCOPY WITH PROPOFOL N/A 12/28/2015   Procedure: COLONOSCOPY WITH PROPOFOL;  Surgeon: Wallace Cullens, MD;  Location: [facility] ENDOSCOPY;  Service: Gastroenterology;  Laterality: N/A;  . LAPAROSCOPIC BILATERAL SALPINGO OOPHERECTOMY Bilateral 12/08/2017   Procedure: LAPAROSCOPIC  BILATERAL SALPINGO OOPHORECTOMY;  Surgeon: Ward, Elenora Fender, MD;  Location: [facility] ORS;  Service: Gynecology;  Laterality: Bilateral;  . LAPAROSCOPIC LYSIS OF ADHESIONS  12/08/2017   Procedure: LAPAROSCOPIC LYSIS OF ADHESIONS;  Surgeon: Ward, Elenora Fender, MD;  Location: [facility] ORS;  Service: Gynecology;;  . LYSIS OF ADHESION  12/08/2017   Procedure: URETERAL LYSIS;  Surgeon: Ward, Elenora Fender, MD;  Location: [facility] ORS;  Service: Gynecology;;  . OPEN REDUCTION INTERNAL FIXATION (ORIF) DISTAL RADIAL FRACTURE Left 07/22/2017   Procedure: LEFT OPEN REDUCTION INTERNAL FIXATION (ORIF) DISTAL RADIAL FRACTURE AND REPAIR AS INDICATED;  Surgeon: Bradly Bienenstock, MD;  Location: [facility] OR;  Service: Orthopedics;  Laterality: Left;  Marland Kitchen VAGINAL DELIVERY     had birth center delivery    Allergies Tioconazole; Aspirin; Monistat [miconazole]; and Penicillins  Social History Social History   Tobacco Use  . Smoking status: Former Smoker    Types: Cigarettes    Last attempt to quit: 08/16/1985    Years since quitting: 32.9  . Smokeless tobacco: Never Used  . Tobacco comment: smoked from age [age]  Substance Use Topics  . Alcohol use: Yes    Alcohol/week: 0.0 standard drinks    Comment: daily glass of wine  . Drug use: No   Review of Systems Constitutional: Negative for fever. Cardiovascular: Negative for chest pain. Respiratory: Negative for shortness of breath. Gastrointestinal: Negative for abdominal pain, vomiting and diarrhea. Musculoskeletal: Positive for mild pain in the third digit of the right foot Skin:  Right foot and leg itching with erythema Neurological: Negative for headaches, focal weakness or numbness.  All systems negative/normal/unremarkable except as stated in the HPI  ____________________________________________   PHYSICAL EXAM:  VITAL SIGNS: ED Triage Vitals  Enc Vitals Group     BP 07/29/18 1529 (!) 143/85     Pulse Rate 07/29/18 1529 78     Resp 07/29/18 1529 16     Temp 07/29/18  1529 98.8 F (37.1 C)     Temp Source 07/29/18 1529 Oral     SpO2 07/29/18 1529 98 %     Weight --      Height --      Head Circumference --      Peak Flow --      Pain Score 07/29/18 1534 2     Pain Loc --      Pain Edu? --      Excl. in GC? --    Constitutional: Alert and oriented. Well appearing and in no distress. Eyes: Conjunctivae are normal. Normal extraocular movements. Cardiovascular: Normal rate, regular rhythm. No murmurs, rubs, or gallops. Respiratory: Normal respiratory effort without tachypnea nor retractions. Breath sounds are clear and equal bilaterally. No wheezes/rales/rhonchi. Gastrointestinal: Soft and nontender. Normal bowel sounds Musculoskeletal: Mild pain with range of motion of the right third digit, there is extensive erythema with some ecchymosis as well of the right third digit.  There is diffuse swelling with erythema that extends onto the dorsum of the foot and up the right leg to the knee consistent with lymphangitis. Neurologic:  Normal speech and language. No gross focal neurologic deficits are appreciated.  Skin: Erythema, ecchymosis and discoloration of the right third digit extending up the right leg Psychiatric: Mood and affect are normal. Speech and behavior are normal.  ____________________________________________  ED COURSE:  As part of my medical decision making, I reviewed the following data within the electronic MEDICAL RECORD NUMBER History obtained from family if available, nursing notes, old chart and ekg, as well as notes from prior ED visits. Patient presented for possible cellulitis, we will assess with labs and imaging as indicated at this time.   Procedures ____________________________________________   LABS (pertinent positives/negatives)  Labs Reviewed  COMPREHENSIVE METABOLIC PANEL - Abnormal; Notable for the following components:      Result Value   Glucose, Bld 108 (*)    All other components within normal limits  CBC WITH  DIFFERENTIAL/PLATELET  LACTIC ACID, PLASMA    RADIOLOGY Images were viewed by me Right foot x-ray IMPRESSION: Mild dorsal right foot soft tissue swelling. No specific radiographic findings of osteomyelitis. Mild first MTP joint osteoarthritis.   ____________________________________________  DIFFERENTIAL DIAGNOSIS   Cellulitis, lymphangitis, sepsis, osteomyelitis, abscess  FINAL ASSESSMENT AND PLAN  Right lower extremity cellulitis, lymphangitis   Plan: The patient had presented for right lower extremity erythema extending from the right third digit. Patient's labs were surprisingly normal. Patient's imaging was negative.  She has just completed a course of doxycycline, will likely need IV antibiotics because she has failed outpatient treatment.  I will discuss with the hospitalist for admission.   Ulice Dash, MD   Note: This note was generated in part or whole with voice recognition software. Voice recognition is usually quite accurate but there are transcription errors that can and very often do occur. I apologize for any typographical errors that were not detected and corrected.     Emily Filbert, MD 07/29/18 [id number]

## 2018-07-30 ENCOUNTER — Telehealth: Payer: Self-pay

## 2018-07-30 ENCOUNTER — Other Ambulatory Visit: Payer: Self-pay

## 2018-07-30 LAB — CBC
HCT: 38.1 % (ref 36.0–46.0)
HEMOGLOBIN: 12.3 g/dL (ref 12.0–15.0)
MCH: 28.9 pg (ref 26.0–34.0)
MCHC: 32.3 g/dL (ref 30.0–36.0)
MCV: 89.6 fL (ref 80.0–100.0)
NRBC: 0 % (ref 0.0–0.2)
Platelets: 147 10*3/uL — ABNORMAL LOW (ref 150–400)
RBC: 4.25 MIL/uL (ref 3.87–5.11)
RDW: 12.6 % (ref 11.5–15.5)
WBC: 4.2 10*3/uL (ref 4.0–10.5)

## 2018-07-30 LAB — BASIC METABOLIC PANEL
ANION GAP: 6 (ref 5–15)
BUN: 11 mg/dL (ref 8–23)
CO2: 29 mmol/L (ref 22–32)
Calcium: 8.9 mg/dL (ref 8.9–10.3)
Chloride: 106 mmol/L (ref 98–111)
Creatinine, Ser: 0.76 mg/dL (ref 0.44–1.00)
GLUCOSE: 134 mg/dL — AB (ref 70–99)
POTASSIUM: 3.5 mmol/L (ref 3.5–5.1)
Sodium: 141 mmol/L (ref 135–145)

## 2018-07-30 MED ORDER — LEVOTHYROXINE SODIUM 50 MCG PO TABS: 25.0000 ug | ORAL_TABLET | Freq: Every day | ORAL | Status: DC

## 2018-07-30 NOTE — Discharge Instructions (Signed)

## 2018-07-30 NOTE — Telephone Encounter (Signed)
Copied from CRM [id number]. Topic: Appointment Scheduling - Scheduling Inquiry for Clinic >> Jul 30, 2018  3:25 PM Crist Infante wrote: Reason for CRM: pt is being dc'd from [facility] today and needs a hosp floow up appt next week. Advised the asst someone will reach out to the pt after she gets home tomorrow to schedule the appt

## 2018-07-30 NOTE — Discharge Summary (Signed)
[facility] - [place] at [facility]   PATIENT NAME: Jodi Stewart    MR#:  [id number]  DATE OF BIRTH:  November 02, 1955  DATE OF ADMISSION:  07/29/2018 ADMITTING PHYSICIAN: Milagros Loll, MD  DATE OF DISCHARGE:07/30/2018  PRIMARY CARE PHYSICIAN: Dale Sellers, MD    ADMISSION DIAGNOSIS:  Lymphangitis [I89.1] Cellulitis of right lower extremity [L03.115]  DISCHARGE DIAGNOSIS:  Right 3rd toe cellulitis  SECONDARY DIAGNOSIS:   Past Medical History:  Diagnosis Date  . Complication of anesthesia    long term anesthesia effects...months later  . Fibrocystic breast    pt is BRACA neg   . Graves disease    had iodine treatment 1990's, now hypothyroid-   2016-2018   . Hypothyroidism   . Laceration of head    x2 post falls  . Lichen planus     HOSPITAL COURSE:  Jodi Stewart  is a 62 y.o. female with a known history of hypothyroidism presents to the emergency room due to quickly worsening redness from her third toe cellulitis extending up to the knee.  Patient had this problem for 3 weeks now.  Initially saw her primary care physician and was started on doxycycline.  The course were extended to 10 days.  *Right third toe cellulitis extending up into the leg. Recent treatment with doxycycline for 10 days and has reoccurred.   -pt received IV vancomycin x 2 doses.  No fever.  Normal WBC.   -Monitor for worsening, fever--none here -patient feels a lot better. No fever. Eating well. Low but swelling on the right lower extremity. She has some patchy redness on the tibial shin. Skin temperature appears normal. -Received a dose of IM clindamycin at urgent care and has been given prescription for Bactrim for seven days which I told patient to completed. -Patient will need to see podiatry as outpatient. I asked her if she would like to see versus finish up Bactrim and see Dr. Ether Griffins. She chose the latter. -He was explained to keep an eye on signs symptoms of  worsening cellulitis and return to ER/urgent care if so.  *Hypothyroidism.  Continue replacement from home.  DVT prophylaxis with Lovenox   DC to home and follow-up with podiatry as outpatient. Patient is agreeable with plan. CONSULTS OBTAINED:    DRUG ALLERGIES:   Allergies  Allergen Reactions  . Tioconazole Itching  . Aspirin Rash    Welts- childhood allergy  . Monistat [Miconazole] Itching and Other (See Comments)    Itching/discomfort  . Penicillins Rash    Has patient had a PCN reaction causing immediate rash, facial/tongue/throat swelling, SOB or lightheadedness with hypotension: Unknown Has patient had a PCN reaction causing severe rash involving mucus membranes or skin necrosis: Unknown Has patient had a PCN reaction that required hospitalization: No Has patient had a PCN reaction occurring within the last 10 years: No If all of the above answers are "NO", then may proceed with Cephalosporin use.  Childhood allergy    DISCHARGE MEDICATIONS:   Allergies as of 07/30/2018      Reactions   Tioconazole Itching   Aspirin Rash   Welts- childhood allergy   Monistat [miconazole] Itching, Other (See Comments)   Itching/discomfort   Penicillins Rash   Has patient had a PCN reaction causing immediate rash, facial/tongue/throat swelling, SOB or lightheadedness with hypotension: Unknown Has patient had a PCN reaction causing severe rash involving mucus membranes or skin necrosis: Unknown Has patient had a PCN reaction that required hospitalization: No Has patient  had a PCN reaction occurring within the last 10 years: No If all of the above answers are "NO", then may proceed with Cephalosporin use.  Childhood allergy      Medication List    STOP taking these medications   DHA PO   doxycycline 100 MG tablet Commonly known as:  VIBRA-TABS   mupirocin cream 2 % Commonly known as:  BACTROBAN   triamcinolone cream 0.1 % Commonly known as:  KENALOG     TAKE these  medications   Alpha-Lipoic Acid 600 MG Caps Take 600 mg by mouth every evening.   ibuprofen 800 MG tablet Commonly known as:  ADVIL,MOTRIN Take 1 tablet (800 mg total) by mouth every 6 (six) hours.   levothyroxine 50 MCG tablet Commonly known as:  SYNTHROID, LEVOTHROID TAKE ONE TABLET BY MOUTH EVERY DAY BEFORE BREAKFAST.   RESVERATROL PO Take 200 mg by mouth daily with supper.   sulfamethoxazole-trimethoprim 800-160 MG tablet Commonly known as:  BACTRIM DS,SEPTRA DS Take 1 tablet by mouth 2 (two) times daily.   vitamin B-12 500 MCG tablet Commonly known as:  CYANOCOBALAMIN Take 500 mcg by mouth daily with supper.       If you experience worsening of your admission symptoms, develop shortness of breath, life threatening emergency, suicidal or homicidal thoughts you must seek medical attention immediately by calling 911 or calling your MD immediately  if symptoms less severe.  You Must read complete instructions/literature along with all the possible adverse reactions/side effects for all the Medicines you take and that have been prescribed to you. Take any new Medicines after you have completely understood and accept all the possible adverse reactions/side effects.   Please note  You were cared for by a hospitalist during your hospital stay. If you have any questions about your discharge medications or the care you received while you were in the hospital after you are discharged, you can call the unit and asked to speak with the hospitalist on call if the hospitalist that took care of you is not available. Once you are discharged, your primary care physician will handle any further medical issues. Please note that NO REFILLS for any discharge medications will be authorized once you are discharged, as it is imperative that you return to your primary care physician (or establish a relationship with a primary care physician if you do not have one) for your aftercare needs so that they can  reassess your need for medications and monitor your lab values. Today   SUBJECTIVE   Patient feels a whole lot better. No fever. Pain over the right third toe  VITAL SIGNS:  Blood pressure 110/67, pulse (!) 59, temperature 98.2 F (36.8 C), temperature source Oral, resp. rate 18, height 5\' 9"  (1.753 m), weight 72.7 kg, last menstrual period 09/18/2003, SpO2 98 %.  I/O:    Intake/Output Summary (Last 24 hours) at 07/30/2018 1514 Last data filed at 07/30/2018 0700 Gross per 24 hour  Intake 156.78 ml  Output 0 ml  Net 156.78 ml    PHYSICAL EXAMINATION:  GENERAL:  62 y.o.-year-old patient lying in the bed with no acute distress.  EYES: Pupils equal, round, reactive to light and accommodation. No scleral icterus. Extraocular muscles intact.  HEENT: Head atraumatic, normocephalic. Oropharynx and nasopharynx clear.  NECK:  Supple, no jugular venous distention. No thyroid enlargement, no tenderness.  LUNGS: Normal breath sounds bilaterally, no wheezing, rales,rhonchi or crepitation. No use of accessory muscles of respiration.  CARDIOVASCULAR: S1, S2 normal. No  murmurs, rubs, or gallops.  ABDOMEN: Soft, non-tender, non-distended. Bowel sounds present. No organomegaly or mass.  EXTREMITIES: No pedal edema, cyanosis, or clubbing. Right third toe sheepskin appears drunken with scaling. No discharge. Scab on the right lateral aspect. No bleeding no foul smelling discharge. Few patchy cellulosic/redness/erythema noted on the tibial shin. Skin temperature normal on the tibial shin NEUROLOGIC: Cranial nerves II through XII are intact. Muscle strength 5/5 in all extremities. Sensation intact. Gait not checked.  PSYCHIATRIC: The patient is alert and oriented x 3.  SKIN: No obvious rash, lesion, or ulcer.   DATA REVIEW:   CBC  Recent Labs  Lab 07/30/18 0459  WBC 4.2  HGB 12.3  HCT 38.1  PLT 147*    Chemistries  Recent Labs  Lab 07/29/18 1537 07/30/18 0459  NA 139 141  K 3.7 3.5  CL  104 106  CO2 26 29  GLUCOSE 108* 134*  BUN 11 11  CREATININE 0.87 0.76  CALCIUM 9.0 8.9  AST 28  --   ALT 24  --   ALKPHOS 47  --   BILITOT 0.6  --     Microbiology Results   No results found for this or any previous visit (from the past 240 hour(s)).  RADIOLOGY:  Dg Foot Complete Right  Result Date: 07/29/2018 CLINICAL DATA:  Erythema and swelling in the right foot. No reported injury. EXAM: RIGHT FOOT COMPLETE - 3+ VIEW COMPARISON:  None. FINDINGS: Mild soft tissue swelling in the dorsal right foot. No fracture or dislocation. No suspicious focal osseous lesion. Mild first metatarsal-phalangeal joint osteoarthritis. No osseous erosions or periosteal reaction. No radiopaque foreign body. No appreciable soft tissue gas. IMPRESSION: Mild dorsal right foot soft tissue swelling. No specific radiographic findings of osteomyelitis. Mild first MTP joint osteoarthritis. Electronically Signed   By: Delbert Phenix M.D.   On: 07/29/2018 18:29     Management plans discussed with the patient, family and they are in agreement.  CODE STATUS:     Code Status Orders  (From admission, onward)         Start     Ordered   07/29/18 1946  Full code  Continuous     07/29/18 1946        Code Status History    This patient has a current code status but no historical code status.      TOTAL TIME TAKING CARE OF THIS PATIENT: *40** minutes.    Enedina Finner M.D on 07/30/2018 at 3:14 PM  Between 7am to 6pm - Pager - [phone] After 6pm go to [email or web address] - Social research officer, government  [facility]  Office  [phone]  CC: Primary care physician; Dale Advance, MD

## 2018-07-30 NOTE — Telephone Encounter (Signed)
Patient admitted 07/29/18 in hospital Left message on voicemail to follow up with patient

## 2018-07-31 LAB — HIV ANTIBODY (ROUTINE TESTING W REFLEX): HIV SCREEN 4TH GENERATION: NONREACTIVE

## 2018-07-31 NOTE — Telephone Encounter (Signed)
How soon is appt needed

## 2018-08-01 NOTE — Telephone Encounter (Signed)
I spoke with you about this pt this morning. OK to schedule on Fri @ 3:30 or next Thursday at 12.

## 2018-08-01 NOTE — Telephone Encounter (Signed)
Transition Care Management Follow-up Telephone Call  How have you been since you were released from the hospital? Patient stated she is doing well she has returned to work.   Do you understand why you were in the hospital? yes   Do you understand the discharge instrcutions? yes  Items Reviewed:  Medications reviewed: yes  Allergies reviewed: yes  Dietary changes reviewed: yes  Referrals reviewed: yes   Functional Questionnaire:   Activities of Daily Living (ADLs):   She states they are independent in the following: ambulation, bathing and hygiene, feeding, continence, grooming, toileting and dressing States they require assistance with the following: No assistance required.   Any transportation issues/concerns?: no   Any patient concerns? no   Confirmed importance and date/time of follow-up visits scheduled: yes   Confirmed with patient if condition begins to worsen call PCP or go to the ER.  Patient was given the Call-a-Nurse line [phone]: yes

## 2018-08-06 NOTE — Telephone Encounter (Signed)
Patient cancelled hospital follow up appointment on 08/10/18

## 2018-08-10 ENCOUNTER — Inpatient Hospital Stay: Payer: 59 | Admitting: Internal Medicine

## 2018-10-23 ENCOUNTER — Encounter: Payer: 59 | Admitting: Internal Medicine

## 2018-10-25 ENCOUNTER — Other Ambulatory Visit: Payer: Self-pay | Admitting: Obstetrics & Gynecology

## 2018-10-25 DIAGNOSIS — Z1231 Encounter for screening mammogram for malignant neoplasm of breast: Secondary | ICD-10-CM

## 2018-11-20 ENCOUNTER — Encounter: Payer: Self-pay | Admitting: Internal Medicine

## 2018-12-03 ENCOUNTER — Ambulatory Visit
Admission: RE | Admit: 2018-12-03 | Discharge: 2018-12-03 | Disposition: A | Discharge status: Home / Self Care | Payer: 59 | Source: Ambulatory Visit | Attending: Obstetrics & Gynecology | Admitting: Obstetrics & Gynecology

## 2018-12-03 DIAGNOSIS — Z1231 Encounter for screening mammogram for malignant neoplasm of breast: Secondary | ICD-10-CM | POA: Diagnosis not present

## 2019-04-22 IMAGING — MG MM DIGITAL SCREENING BILAT W/ TOMO W/ CAD
9 of 12 series · 9 of 28 positions shown · non-contrast
Comparison: Previous exam(s).

CLINICAL DATA: Screening.

EXAM:
DIGITAL SCREENING BILATERAL MAMMOGRAM WITH TOMO AND CAD

[R CC]
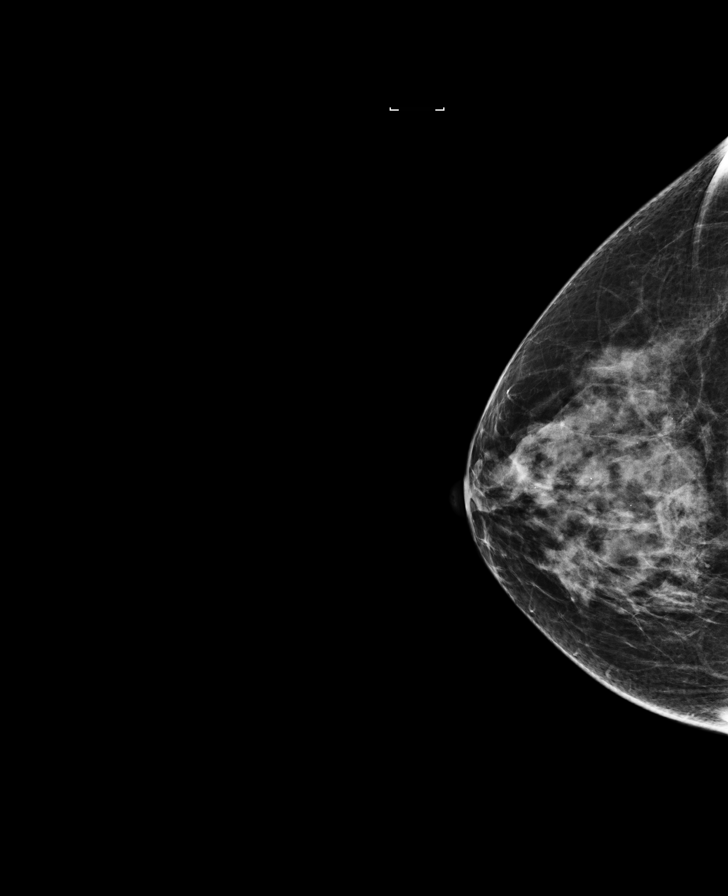

[L MLO]
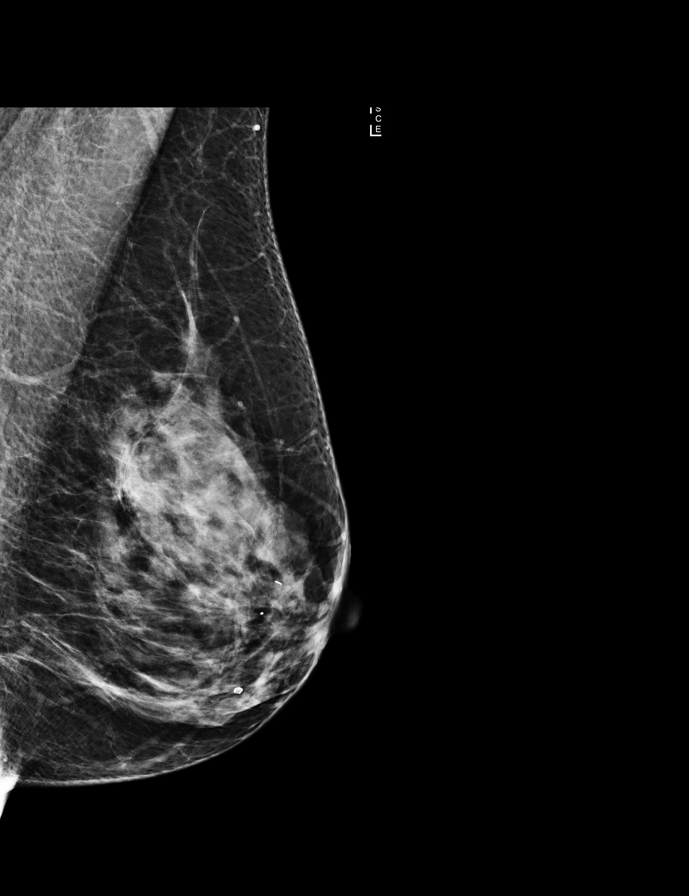

[R MLO synth-2D]
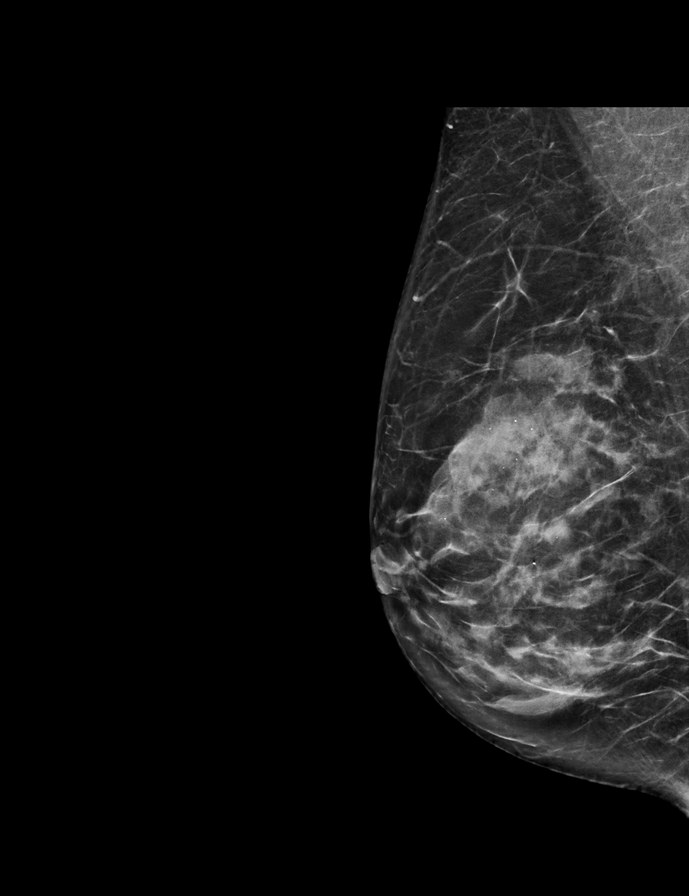

[L CC]
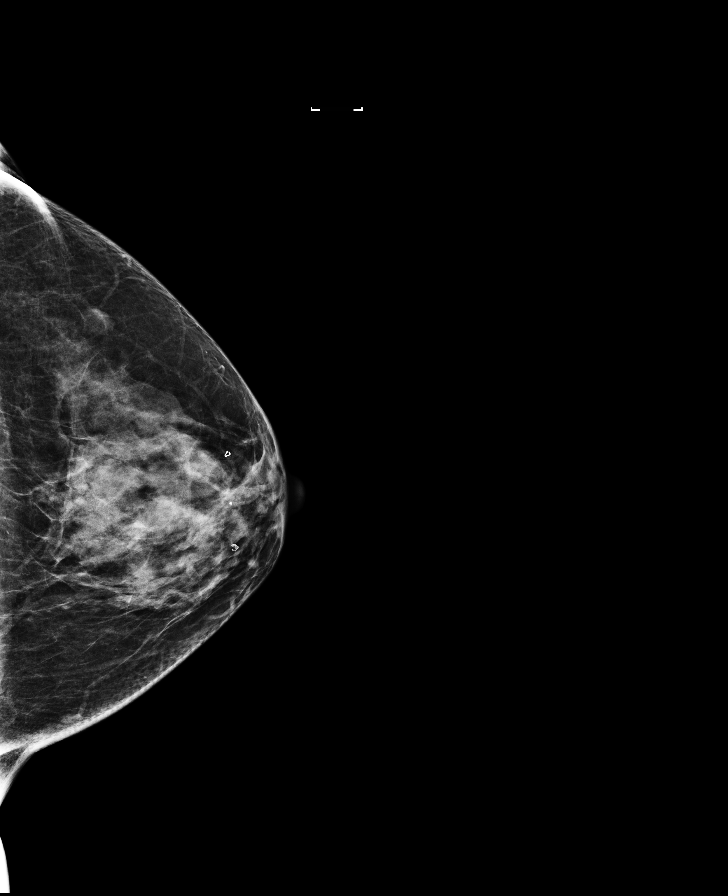

[R MLO]
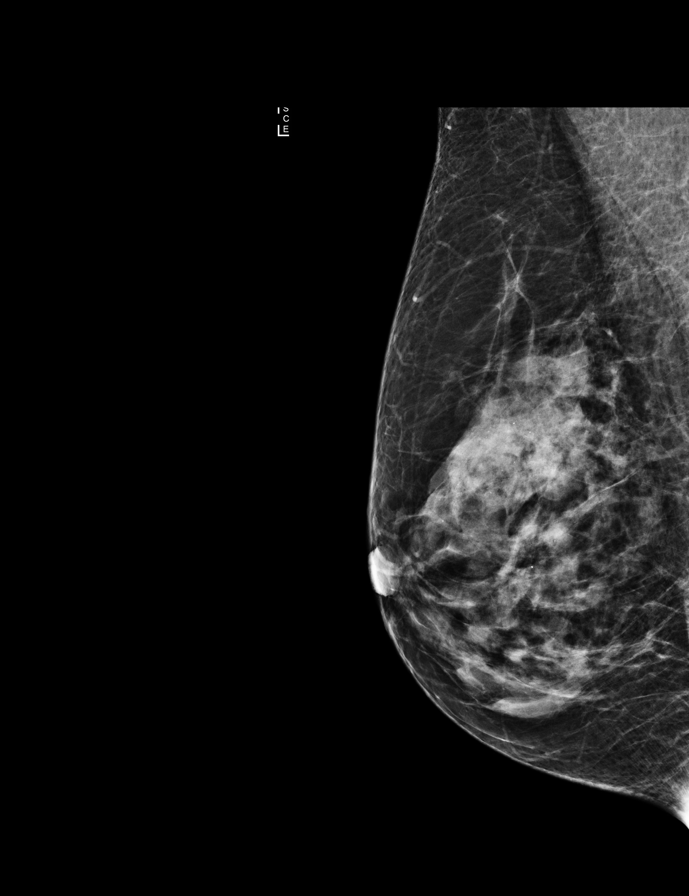

[L MLO synth-2D]
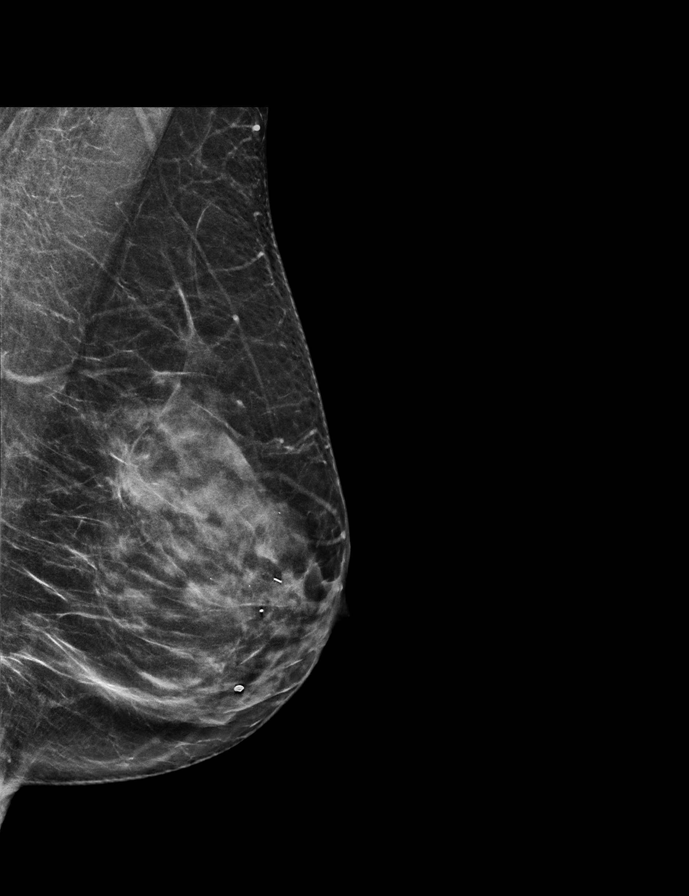

[R CC synth-2D]
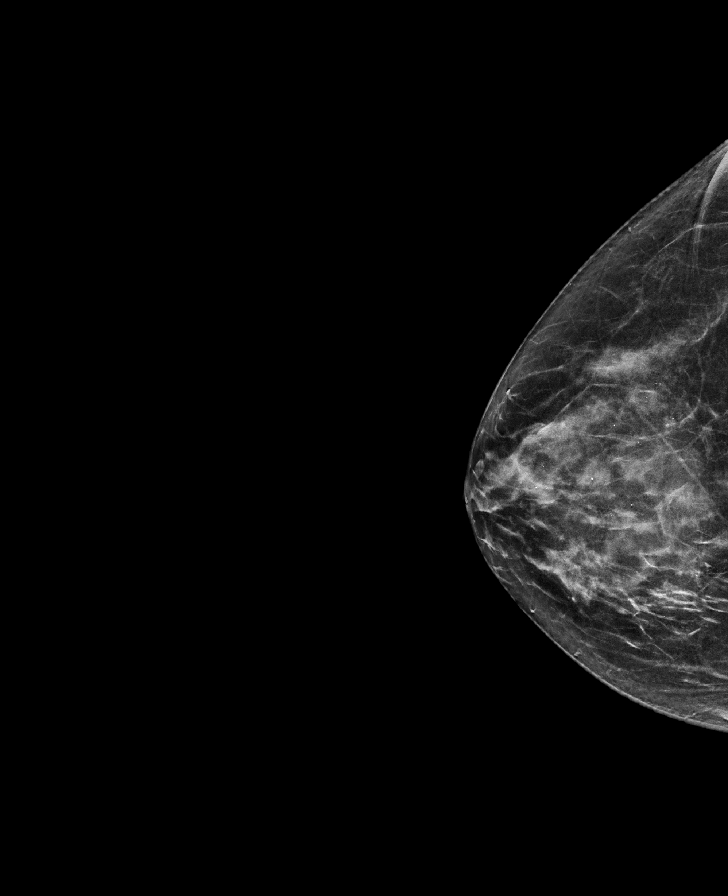

[L CC synth-2D]
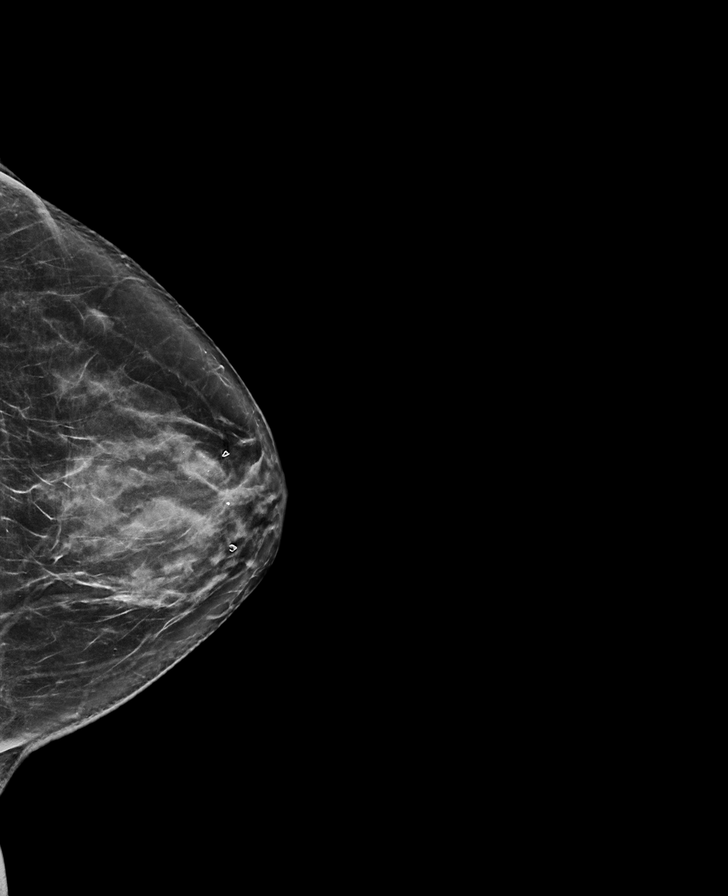

[L CC tomo · tomo slice 35/69.0]
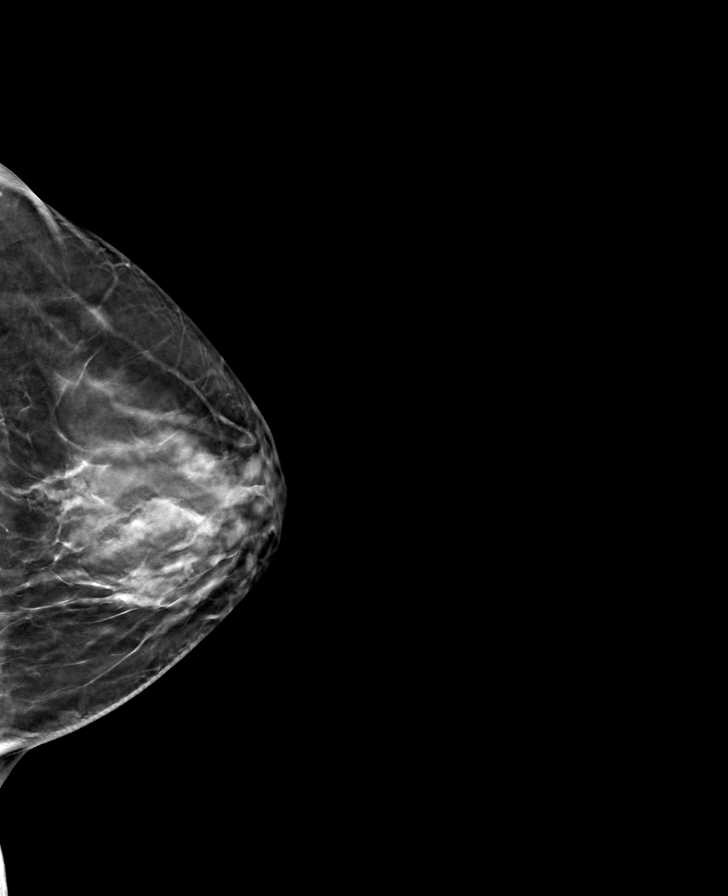

[9 of 28 positions shown; findings below may reference images not displayed]

ACR Breast Density Category c: The breast tissue is heterogeneously
dense, which may obscure small masses.
FINDINGS: There are no findings suspicious for malignancy. Images were
processed with CAD.
IMPRESSION: No mammographic evidence of malignancy. A result letter of this
screening mammogram will be mailed directly to the patient.

RECOMMENDATION:
Screening mammogram in one year. (Code:[id number]-[id number])

BI-RADS CATEGORY  1: Negative.

## 2019-12-17 ENCOUNTER — Other Ambulatory Visit: Payer: Self-pay | Admitting: Obstetrics & Gynecology

## 2019-12-17 DIAGNOSIS — Z1231 Encounter for screening mammogram for malignant neoplasm of breast: Secondary | ICD-10-CM

## 2019-12-19 ENCOUNTER — Ambulatory Visit
Admission: RE | Admit: 2019-12-19 | Discharge: 2019-12-19 | Disposition: A | Discharge status: Home / Self Care | Payer: BC Managed Care – PPO | Source: Ambulatory Visit | Attending: Obstetrics & Gynecology | Admitting: Obstetrics & Gynecology

## 2019-12-19 DIAGNOSIS — Z1231 Encounter for screening mammogram for malignant neoplasm of breast: Secondary | ICD-10-CM | POA: Diagnosis present

## 2021-10-14 ENCOUNTER — Telehealth: Payer: Self-pay | Admitting: Internal Medicine

## 2021-10-14 NOTE — Telephone Encounter (Signed)
Called patient unable to LM. Needs appt. Has not seen Dr Nicki Reaper since 2019

## 2021-10-14 NOTE — Telephone Encounter (Signed)
Pt called in regards to water rentention. Pt has noticed she has put on a few more pounds and is concerned she has an active thyroid again. Pt would like to get labs done if at all possible. Pt says she doesn't feel like its an emergency but it is giving her some concerns.

## 2021-10-29 ENCOUNTER — Ambulatory Visit: Payer: BC Managed Care – PPO | Admitting: Internal Medicine

## 2022-01-14 ENCOUNTER — Encounter: Payer: BC Managed Care – PPO | Admitting: Internal Medicine

## 2022-05-27 ENCOUNTER — Other Ambulatory Visit (HOSPITAL_COMMUNITY)
Admission: RE | Admit: 2022-05-27 | Discharge: 2022-05-27 | Disposition: A | Discharge status: Home / Self Care | Payer: BC Managed Care – PPO | Source: Ambulatory Visit | Attending: Internal Medicine | Admitting: Internal Medicine

## 2022-05-27 ENCOUNTER — Encounter: Payer: Self-pay | Admitting: Internal Medicine

## 2022-05-27 ENCOUNTER — Ambulatory Visit (INDEPENDENT_AMBULATORY_CARE_PROVIDER_SITE_OTHER): Payer: BC Managed Care – PPO | Admitting: Internal Medicine

## 2022-05-27 VITALS — BP 122/82 | HR 62 | Temp 97.9°F | Ht 69.0 in | Wt 172.8 lb

## 2022-05-27 DIAGNOSIS — R413 Other amnesia: Secondary | ICD-10-CM

## 2022-05-27 DIAGNOSIS — E0500 Thyrotoxicosis with diffuse goiter without thyrotoxic crisis or storm: Secondary | ICD-10-CM

## 2022-05-27 DIAGNOSIS — Z8041 Family history of malignant neoplasm of ovary: Secondary | ICD-10-CM

## 2022-05-27 DIAGNOSIS — Z1231 Encounter for screening mammogram for malignant neoplasm of breast: Secondary | ICD-10-CM | POA: Diagnosis not present

## 2022-05-27 DIAGNOSIS — R5383 Other fatigue: Secondary | ICD-10-CM

## 2022-05-27 DIAGNOSIS — R739 Hyperglycemia, unspecified: Secondary | ICD-10-CM | POA: Diagnosis not present

## 2022-05-27 DIAGNOSIS — Z0000 Encounter for general adult medical examination without abnormal findings: Secondary | ICD-10-CM | POA: Diagnosis not present

## 2022-05-27 LAB — CBC WITH DIFFERENTIAL/PLATELET
Basophils Absolute: 0 10*3/uL (ref 0.0–0.1)
Basophils Relative: 0.6 % (ref 0.0–3.0)
Eosinophils Absolute: 0.1 10*3/uL (ref 0.0–0.7)
Eosinophils Relative: 1.5 % (ref 0.0–5.0)
HCT: 39 % (ref 36.0–46.0)
Hemoglobin: 13.1 g/dL (ref 12.0–15.0)
Lymphocytes Relative: 23.5 % (ref 12.0–46.0)
Lymphs Abs: 1.4 10*3/uL (ref 0.7–4.0)
MCHC: 33.6 g/dL (ref 30.0–36.0)
MCV: 90 fl (ref 78.0–100.0)
Monocytes Absolute: 0.4 10*3/uL (ref 0.1–1.0)
Monocytes Relative: 6.4 % (ref 3.0–12.0)
Neutro Abs: 4 10*3/uL (ref 1.4–7.7)
Neutrophils Relative %: 68 % (ref 43.0–77.0)
Platelets: 209 10*3/uL (ref 150.0–400.0)
RBC: 4.34 Mil/uL (ref 3.87–5.11)
RDW: 13.6 % (ref 11.5–15.5)
WBC: 5.9 10*3/uL (ref 4.0–10.5)

## 2022-05-27 LAB — LIPID PANEL
Cholesterol: 153 mg/dL (ref 0–200)
HDL: 98.1 mg/dL (ref >39.00)
LDL Cholesterol: 49 mg/dL (ref 0–99)
NonHDL: 55.22
Total CHOL/HDL Ratio: 2
Triglycerides: 32 mg/dL (ref 0.0–149.0)
VLDL: 6.4 mg/dL (ref 0.0–40.0)

## 2022-05-27 LAB — HEPATIC FUNCTION PANEL
ALT: 21 U/L (ref 0–35)
AST: 22 U/L (ref 0–37)
Albumin: 4.4 g/dL (ref 3.5–5.2)
Alkaline Phosphatase: 51 U/L (ref 39–117)
Bilirubin, Direct: 0.2 mg/dL (ref 0.0–0.3)
Total Bilirubin: 0.8 mg/dL (ref 0.2–1.2)
Total Protein: 7 g/dL (ref 6.0–8.3)

## 2022-05-27 LAB — BASIC METABOLIC PANEL
BUN: 11 mg/dL (ref 6–23)
CO2: 32 mEq/L (ref 19–32)
Calcium: 9.4 mg/dL (ref 8.4–10.5)
Chloride: 103 mEq/L (ref 96–112)
Creatinine, Ser: 0.74 mg/dL (ref 0.40–1.20)
GFR: 85.23 mL/min (ref >60.00)
Glucose, Bld: 97 mg/dL (ref 70–99)
Potassium: 4.3 mEq/L (ref 3.5–5.1)
Sodium: 142 mEq/L (ref 135–145)

## 2022-05-27 LAB — LIPASE: Lipase: 21 U/L (ref 11.0–59.0)

## 2022-05-27 LAB — TSH: TSH: 2.88 u[IU]/mL (ref 0.35–5.50)

## 2022-05-27 LAB — HEMOGLOBIN A1C: Hgb A1c MFr Bld: 6 % (ref 4.6–6.5)

## 2022-05-27 NOTE — Progress Notes (Unsigned)
Patient ID: Cire Deyarmin Gangemi, female   DOB: 05-05-1956, 66 y.o.   MRN: [id number]   Subjective:    Patient ID: Elayne Gruver Chauca, female    DOB: [birth year], 66 y.o.   MRN: [id number]   Patient here for  Chief Complaint  Patient presents with   Annual Exam   .   HPI Here for physical exam.  Have not seen her since 2019.  Saw Dr Leonides Schanz (gyn) - 11/2020.  Increased stress.  History of left wrist fracture.  Built and moved into a new home.  Sister with advanced cancer.  Son passed.  Discussed.  Does not feel needs any further intervention.  Has good support.  Tries to stay active.  No chest pain or sob reported.  No cough or congestion.  No acid reflux.  No abdominal pain. Saw neurology - f/u concern for memory problems.  Normal brain MRI (neuro quant study).  No abdominal pain.  Has noticed feels "puny" after eating.  No vomiting.  No bowel change.  Family history - ovarian cancer.  Has had genetic testing. Is also s/p lap BSO.  Saw endocrinology - off synthroid.     Past Medical History:  Diagnosis Date   Complication of anesthesia    long term anesthesia effects...months later   Fibrocystic breast    pt is BRACA neg    Graves disease    had iodine treatment 1990's, now hypothyroid-   2016-2018    Hypothyroidism    Laceration of head    x2 post falls   Lichen planus    Past Surgical History:  Procedure Laterality Date   ABDOMINAL HYSTERECTOMY  2004   BREAST BIOPSY Left 2002   needle bx   COLONOSCOPY WITH PROPOFOL N/A 12/28/2015   Procedure: COLONOSCOPY WITH PROPOFOL;  Surgeon: Hulen Luster, MD;  Location: [facility] ENDOSCOPY;  Service: Gastroenterology;  Laterality: N/A;   LAPAROSCOPIC BILATERAL SALPINGO OOPHERECTOMY Bilateral 12/08/2017   Procedure: LAPAROSCOPIC BILATERAL SALPINGO OOPHORECTOMY;  Surgeon: Ward, Honor Loh, MD;  Location: [facility] ORS;  Service: Gynecology;  Laterality: Bilateral;   LAPAROSCOPIC LYSIS OF ADHESIONS  12/08/2017   Procedure: LAPAROSCOPIC LYSIS OF  ADHESIONS;  Surgeon: Ward, Honor Loh, MD;  Location: [facility] ORS;  Service: Gynecology;;   LYSIS OF ADHESION  12/08/2017   Procedure: URETERAL LYSIS;  Surgeon: Ward, Honor Loh, MD;  Location: [facility] ORS;  Service: Gynecology;;   OPEN REDUCTION INTERNAL FIXATION (ORIF) DISTAL RADIAL FRACTURE Left 07/22/2017   Procedure: LEFT OPEN REDUCTION INTERNAL FIXATION (ORIF) DISTAL RADIAL FRACTURE AND REPAIR AS INDICATED;  Surgeon: Iran Planas, MD;  Location: [place];  Service: Orthopedics;  Laterality: Left;   VAGINAL DELIVERY     had birth center delivery   Family History  Problem Relation Age of Onset   Goiter Mother    Mental illness Mother    Ovarian cancer Mother        died 29   Breast cancer Maternal Aunt 67   Breast cancer Maternal Grandmother 26   Goiter Sister    Breast cancer Sister 1   Cancer Maternal Aunt 58       Renal Cancer   Colon cancer Neg Hx    Social History   Socioeconomic History   Marital status: Married    Spouse name: Not on file   Number of children: 1   Years of education: Not on file   Highest education level: Not on file  Occupational History    Employer: [employer]  Tobacco Use  Smoking status: Former    Types: Cigarettes    Quit date: 08/16/1985    Years since quitting: 36.8   Smokeless tobacco: Never   Tobacco comments:    smoked from age [age]  Vaping Use   Vaping Use: Never used  Substance and Sexual Activity   Alcohol use: Yes    Alcohol/week: 0.0 standard drinks of alcohol    Comment: daily glass of wine   Drug use: No   Sexual activity: Not on file  Other Topics Concern   Not on file  Social History Narrative   Not on file   Social Determinants of Health   Financial Resource Strain: Not on file  Food Insecurity: Not on file  Transportation Needs: Not on file  Physical Activity: Not on file  Stress: Not on file  Social Connections: Not on file     Review of Systems  Constitutional:  Negative for appetite change and unexpected weight  change.  HENT:  Negative for congestion, sinus pressure and sore throat.   Eyes:  Negative for pain and visual disturbance.  Respiratory:  Negative for cough, chest tightness and shortness of breath.   Cardiovascular:  Negative for chest pain, palpitations and leg swelling.  Gastrointestinal:  Negative for abdominal pain, diarrhea, nausea and vomiting.  Genitourinary:  Negative for difficulty urinating and dysuria.  Musculoskeletal:  Negative for joint swelling and myalgias.  Skin:  Negative for color change and rash.  Neurological:  Negative for dizziness, light-headedness and headaches.  Hematological:  Negative for adenopathy. Does not bruise/bleed easily.  Psychiatric/Behavioral:  Negative for agitation and dysphoric mood.        Objective:     BP 122/82 (BP Location: Left Arm, Patient Position: Sitting, Cuff Size: Normal)   Pulse 62   Temp 97.9 F (36.6 C) (Oral)   Ht '5\' 9"'  (1.753 m)   Wt 172 lb 12.8 oz (78.4 kg)   LMP 09/18/2003   SpO2 99%   BMI 25.52 kg/m  Wt Readings from Last 3 Encounters:  05/27/22 172 lb 12.8 oz (78.4 kg)  07/29/18 160 lb 3.2 oz (72.7 kg)  07/11/18 162 lb 6 oz (73.7 kg)    Physical Exam Vitals reviewed.  Constitutional:      General: She is not in acute distress.    Appearance: Normal appearance. She is well-developed.  HENT:     Head: Normocephalic and atraumatic.     Right Ear: External ear normal.     Left Ear: External ear normal.  Eyes:     General: No scleral icterus.       Right eye: No discharge.        Left eye: No discharge.     Conjunctiva/sclera: Conjunctivae normal.  Neck:     Thyroid: No thyromegaly.  Cardiovascular:     Rate and Rhythm: Normal rate and regular rhythm.  Pulmonary:     Effort: No tachypnea, accessory muscle usage or respiratory distress.     Breath sounds: Normal breath sounds. No decreased breath sounds or wheezing.  Chest:  Breasts:    Right: No inverted nipple, mass, nipple discharge or tenderness  (no axillary adenopathy).     Left: No inverted nipple, mass, nipple discharge or tenderness (no axilarry adenopathy).  Abdominal:     General: Bowel sounds are normal.     Palpations: Abdomen is soft.     Tenderness: There is no abdominal tenderness.  Genitourinary:    Comments: Normal external genitalia.  Vaginal vault without lesions.  S/p hysterectomy. Pap smear performed.  Could not appreciate any adnexal masses or tenderness.   Musculoskeletal:        General: No swelling or tenderness.     Cervical back: Neck supple.  Lymphadenopathy:     Cervical: No cervical adenopathy.  Skin:    Findings: No erythema or rash.  Neurological:     Mental Status: She is alert and oriented to person, place, and time.  Psychiatric:        Mood and Affect: Mood normal.        Behavior: Behavior normal.      Outpatient Encounter Medications as of 05/27/2022  Medication Sig   [DISCONTINUED] Alpha-Lipoic Acid 600 MG CAPS Take 600 mg by mouth every evening. (Patient not taking: Reported on 05/27/2022)   [DISCONTINUED] ibuprofen (ADVIL,MOTRIN) 800 MG tablet Take 1 tablet (800 mg total) by mouth every 6 (six) hours.   [DISCONTINUED] levothyroxine (SYNTHROID, LEVOTHROID) 50 MCG tablet TAKE ONE TABLET BY MOUTH EVERY DAY BEFORE BREAKFAST. (Patient not taking: Reported on 05/27/2022)   [DISCONTINUED] RESVERATROL PO Take 200 mg by mouth daily with supper. (Patient not taking: Reported on 05/27/2022)   [DISCONTINUED] sulfamethoxazole-trimethoprim (BACTRIM DS,SEPTRA DS) 800-160 MG tablet Take 1 tablet by mouth 2 (two) times daily. (Patient not taking: Reported on 05/27/2022)   [DISCONTINUED] vitamin B-12 (CYANOCOBALAMIN) 500 MCG tablet Take 500 mcg by mouth daily with supper. (Patient not taking: Reported on 05/27/2022)   No facility-administered encounter medications on file as of 05/27/2022.     Lab Results  Component Value Date   WBC 5.9 05/27/2022   HGB 13.1 05/27/2022   HCT 39.0 05/27/2022   PLT 209.0  05/27/2022   GLUCOSE 97 05/27/2022   CHOL 153 05/27/2022   TRIG 32.0 05/27/2022   HDL 98.10 05/27/2022   LDLCALC 49 05/27/2022   ALT 21 05/27/2022   AST 22 05/27/2022   NA 142 05/27/2022   K 4.3 05/27/2022   CL 103 05/27/2022   CREATININE 0.74 05/27/2022   BUN 11 05/27/2022   CO2 32 05/27/2022   TSH 2.88 05/27/2022   HGBA1C 6.0 05/27/2022    MM 3D SCREEN BREAST BILATERAL  Result Date: 12/19/2019 CLINICAL DATA:  Screening. EXAM: DIGITAL SCREENING BILATERAL MAMMOGRAM WITH TOMO AND CAD COMPARISON:  Previous exam(s). ACR Breast Density Category c: The breast tissue is heterogeneously dense, which may obscure small masses. FINDINGS: There are no findings suspicious for malignancy. Images were processed with CAD. IMPRESSION: No mammographic evidence of malignancy. A result letter of this screening mammogram will be mailed directly to the patient. RECOMMENDATION: Screening mammogram in one year. (Code:[id number]-B-01Y) BI-RADS CATEGORY  1: Negative. Electronically Signed   By: Margarette Canada M.D.   On: 12/19/2019 16:17       Assessment & Plan:   Problem List Items Addressed This Visit     Family history of ovarian cancer    Has had genetic testing.  S/p Lap BSO.  Consider f/u with oncology in future for further recommendations.        Fatigue    Check cbc, met c and tsh.        Relevant Orders   Hepatic function panel (Completed)   Basic metabolic panel (Completed)   Lipase (Completed)   Graves disease   Relevant Orders   TSH (Completed)   Health care maintenance    Physical today 05/27/22.  S/p hysterectomy.  Colonoscopy 2017.  Recommended f/u in 10 years.  Overdue mammogram.  Schedule.  Relevant Orders   Cytology - PAP( [place])   Hyperglycemia    Low carb diet and exercise.  Follow met b and a1c.        Relevant Orders   Hemoglobin A1c (Completed)   Memory change    Saw neurology.  MRI ok.  Follow.       Relevant Orders   CBC with Differential/Platelet (Completed)    Lipid panel (Completed)   TSH (Completed)   Other Visit Diagnoses     Routine general medical examination at a health care facility    -  Primary   Encounter for screening mammogram for malignant neoplasm of breast       Relevant Orders   MM 3D SCREEN BREAST BILATERAL        Einar Pheasant, MD

## 2022-05-27 NOTE — Assessment & Plan Note (Signed)
Physical today 05/27/22.  S/p hysterectomy.  Colonoscopy 2017.  Recommended f/u in 10 years.  Overdue mammogram.

## 2022-05-27 NOTE — Patient Instructions (Signed)
Mammogram Delford Field) - 06/24/22 - 8:00

## 2022-05-28 ENCOUNTER — Encounter: Payer: Self-pay | Admitting: Internal Medicine

## 2022-05-28 NOTE — Assessment & Plan Note (Signed)
Check cbc, met c and tsh.    

## 2022-05-28 NOTE — Assessment & Plan Note (Signed)
Low carb diet and exercise.  Follow met b and a1c.   

## 2022-05-28 NOTE — Assessment & Plan Note (Signed)
Saw neurology.  MRI ok.  Follow.

## 2022-05-28 NOTE — Assessment & Plan Note (Signed)
Has had genetic testing.  S/p Lap BSO.  Consider f/u with oncology in future for further recommendations.

## 2022-06-02 LAB — CYTOLOGY - PAP
Comment: NEGATIVE
Diagnosis: NEGATIVE
High risk HPV: NEGATIVE

## 2022-07-22 DIAGNOSIS — J301 Allergic rhinitis due to pollen: Secondary | ICD-10-CM | POA: Diagnosis not present

## 2022-07-22 DIAGNOSIS — J300 Vasomotor rhinitis: Secondary | ICD-10-CM | POA: Diagnosis not present

## 2022-07-22 DIAGNOSIS — R0683 Snoring: Secondary | ICD-10-CM | POA: Diagnosis not present

## 2022-07-22 DIAGNOSIS — L438 Other lichen planus: Secondary | ICD-10-CM | POA: Diagnosis not present

## 2022-08-26 ENCOUNTER — Ambulatory Visit
Admission: RE | Admit: 2022-08-26 | Discharge: 2022-08-26 | Disposition: A | Discharge status: Home / Self Care | Payer: Medicare Other | Source: Ambulatory Visit | Attending: Internal Medicine | Admitting: Internal Medicine

## 2022-08-26 DIAGNOSIS — Z1231 Encounter for screening mammogram for malignant neoplasm of breast: Secondary | ICD-10-CM | POA: Insufficient documentation

## 2022-10-20 ENCOUNTER — Encounter: Payer: Self-pay | Admitting: Nurse Practitioner

## 2022-10-20 ENCOUNTER — Ambulatory Visit (INDEPENDENT_AMBULATORY_CARE_PROVIDER_SITE_OTHER): Payer: Medicare Other | Admitting: Nurse Practitioner

## 2022-10-20 VITALS — BP 118/64 | HR 67 | Temp 98.0°F | Ht 69.0 in | Wt 167.6 lb

## 2022-10-20 DIAGNOSIS — H3509 Other intraretinal microvascular abnormalities: Secondary | ICD-10-CM | POA: Diagnosis not present

## 2022-10-20 NOTE — Assessment & Plan Note (Signed)
Exam normal today in office. No area of redness or subconjunctival hemorrhage noted. Advised patient to return if symptoms return or worsen. Follow up with Ophthalmology as scheduled.

## 2022-10-20 NOTE — Progress Notes (Signed)
  Tomasita Morrow, NP-C Phone: [phone]  Jodi Stewart is a 67 y.o. female who presents today for right eye problem.   Patient reports a red spot, busted blood vessel like, that appeared in her right eye approximately 3 weeks ago. It since resolved. It did come back last week then resolved again. Reports it returned for a third time on Monday and is better today. She reports when it first appeared it was bright red, it has improved and and been less each time it has reappeared. Denies pain. Denies vision changes. Denies headaches. Denies cough or vomiting. Her blood pressure is well controlled. She does have an appointment with her eye doctor on 11/09/22.   Social History   Tobacco Use  Smoking Status Former   Types: Cigarettes   Quit date: 08/16/1985   Years since quitting: 37.2  Smokeless Tobacco Never  Tobacco Comments   smoked from age [age]    No current outpatient medications on file prior to visit.   No current facility-administered medications on file prior to visit.    ROS see history of present illness  Objective  Physical Exam Vitals:   10/20/22 0810  BP: 118/64  Pulse: 67  Temp: 98 F (36.7 C)  SpO2: 99%    BP Readings from Last 3 Encounters:  10/20/22 118/64  05/27/22 122/82  07/30/18 110/67   Wt Readings from Last 3 Encounters:  10/20/22 167 lb 9.6 oz (76 kg)  05/27/22 172 lb 12.8 oz (78.4 kg)  07/29/18 160 lb 3.2 oz (72.7 kg)    Physical Exam Constitutional:      General: She is not in acute distress.    Appearance: Normal appearance.  HENT:     Head: Normocephalic and atraumatic.  Eyes:     General: Lids are normal. Lids are everted, no foreign bodies appreciated.        Right eye: No foreign body, discharge or hordeolum.     Extraocular Movements: Extraocular movements intact.     Conjunctiva/sclera: Conjunctivae normal.     Right eye: No hemorrhage.    Pupils: Pupils are equal, round, and reactive to light.  Cardiovascular:      Rate and Rhythm: Normal rate and regular rhythm.     Heart sounds: Normal heart sounds.  Pulmonary:     Effort: Pulmonary effort is normal.     Breath sounds: Normal breath sounds.  Skin:    General: Skin is warm and dry.  Neurological:     General: No focal deficit present.     Mental Status: She is alert.  Psychiatric:        Mood and Affect: Mood normal.        Behavior: Behavior normal.    Assessment/Plan: Please see individual problem list.  Abnormal blood vessels in right eye Assessment & Plan: Exam normal today in office. No area of redness or subconjunctival hemorrhage noted. Advised patient to return if symptoms return or worsen. Follow up with Ophthalmology as scheduled.    Return if symptoms worsen or fail to improve.   Tomasita Morrow, NP-C [place]

## 2022-12-29 ENCOUNTER — Encounter: Payer: Self-pay | Admitting: Internal Medicine

## 2022-12-29 ENCOUNTER — Ambulatory Visit (INDEPENDENT_AMBULATORY_CARE_PROVIDER_SITE_OTHER): Payer: Medicare Other | Admitting: Internal Medicine

## 2022-12-29 VITALS — BP 130/80 | HR 70 | Temp 98.2°F | Resp 16 | Ht 69.0 in | Wt 164.8 lb

## 2022-12-29 DIAGNOSIS — E0500 Thyrotoxicosis with diffuse goiter without thyrotoxic crisis or storm: Secondary | ICD-10-CM | POA: Diagnosis not present

## 2022-12-29 DIAGNOSIS — R413 Other amnesia: Secondary | ICD-10-CM

## 2022-12-29 DIAGNOSIS — R5383 Other fatigue: Secondary | ICD-10-CM | POA: Diagnosis not present

## 2022-12-29 DIAGNOSIS — R739 Hyperglycemia, unspecified: Secondary | ICD-10-CM | POA: Diagnosis not present

## 2022-12-29 DIAGNOSIS — R198 Other specified symptoms and signs involving the digestive system and abdomen: Secondary | ICD-10-CM | POA: Diagnosis not present

## 2022-12-29 DIAGNOSIS — Z13220 Encounter for screening for lipoid disorders: Secondary | ICD-10-CM

## 2022-12-29 DIAGNOSIS — Z8041 Family history of malignant neoplasm of ovary: Secondary | ICD-10-CM

## 2022-12-29 LAB — URINALYSIS, ROUTINE W REFLEX MICROSCOPIC
Bilirubin Urine: NEGATIVE
Hgb urine dipstick: NEGATIVE
Ketones, ur: NEGATIVE
Leukocytes,Ua: NEGATIVE
Nitrite: NEGATIVE
Specific Gravity, Urine: 1.01 (ref 1.000–1.030)
Total Protein, Urine: NEGATIVE
Urine Glucose: NEGATIVE
Urobilinogen, UA: 0.2 (ref 0.0–1.0)
pH: 7 (ref 5.0–8.0)

## 2022-12-29 LAB — CBC WITH DIFFERENTIAL/PLATELET
Basophils Absolute: 0 10*3/uL (ref 0.0–0.1)
Basophils Relative: 0.9 % (ref 0.0–3.0)
Eosinophils Absolute: 0.1 10*3/uL (ref 0.0–0.7)
Eosinophils Relative: 1.3 % (ref 0.0–5.0)
HCT: 41.2 % (ref 36.0–46.0)
Hemoglobin: 13.7 g/dL (ref 12.0–15.0)
Lymphocytes Relative: 24.6 % (ref 12.0–46.0)
Lymphs Abs: 1.3 10*3/uL (ref 0.7–4.0)
MCHC: 33.2 g/dL (ref 30.0–36.0)
MCV: 91.6 fl (ref 78.0–100.0)
Monocytes Absolute: 0.4 10*3/uL (ref 0.1–1.0)
Monocytes Relative: 6.9 % (ref 3.0–12.0)
Neutro Abs: 3.5 10*3/uL (ref 1.4–7.7)
Neutrophils Relative %: 66.3 % (ref 43.0–77.0)
Platelets: 236 10*3/uL (ref 150.0–400.0)
RBC: 4.5 Mil/uL (ref 3.87–5.11)
RDW: 13.9 % (ref 11.5–15.5)
WBC: 5.3 10*3/uL (ref 4.0–10.5)

## 2022-12-29 LAB — HEPATIC FUNCTION PANEL
ALT: 20 U/L (ref 0–35)
AST: 24 U/L (ref 0–37)
Albumin: 4.8 g/dL (ref 3.5–5.2)
Alkaline Phosphatase: 60 U/L (ref 39–117)
Bilirubin, Direct: 0.4 mg/dL — ABNORMAL HIGH (ref 0.0–0.3)
Total Bilirubin: 1.5 mg/dL — ABNORMAL HIGH (ref 0.2–1.2)
Total Protein: 7.2 g/dL (ref 6.0–8.3)

## 2022-12-29 LAB — HEMOGLOBIN A1C: Hgb A1c MFr Bld: 5.8 % (ref 4.6–6.5)

## 2022-12-29 LAB — BASIC METABOLIC PANEL
BUN: 14 mg/dL (ref 6–23)
CO2: 30 mEq/L (ref 19–32)
Calcium: 9.4 mg/dL (ref 8.4–10.5)
Chloride: 102 mEq/L (ref 96–112)
Creatinine, Ser: 0.63 mg/dL (ref 0.40–1.20)
GFR: 93.07 mL/min (ref >60.00)
Glucose, Bld: 98 mg/dL (ref 70–99)
Potassium: 4.3 mEq/L (ref 3.5–5.1)
Sodium: 141 mEq/L (ref 135–145)

## 2022-12-29 LAB — LIPID PANEL
Cholesterol: 163 mg/dL (ref 0–200)
HDL: 110.9 mg/dL (ref >39.00)
LDL Cholesterol: 48 mg/dL (ref 0–99)
NonHDL: 52.52
Total CHOL/HDL Ratio: 1
Triglycerides: 25 mg/dL (ref 0.0–149.0)
VLDL: 5 mg/dL (ref 0.0–40.0)

## 2022-12-29 LAB — TSH: TSH: 7.21 u[IU]/mL — ABNORMAL HIGH (ref 0.35–5.50)

## 2022-12-29 LAB — VITAMIN B12: Vitamin B-12: 280 pg/mL (ref 211–911)

## 2022-12-29 NOTE — Progress Notes (Signed)
Subjective:    [name], female    DOB: 12/10/1955, 67 y.o.   MRN: [id number]  Patient here for  Jodi Stewart Complaint  Patient presents with   Medical Management of Chronic Issues    HPI Here as a work in - wanted to be seen to discuss "prediabetes, genetic testing and colonoscopy".  Has a family history of ovarian cancer.  Has had genetic testing previously.  Is s/p Lap BSO.  Her testing has been years ago.  Discussed referral now for more recent testing.  Last colonoscopy 2017.  Recommended f/u in 10 years.  Describes stool has changed.  Small narrow stool.  Discussed referral to GI for evaluation - given stool change.  Has been drinking more fluids.  Metamucil.  Also is concerned regarding her blood sugar.  Jodi Stewart is exercising 2x/day.  Trying to watch what Jodi Stewart eats.  Was questioning starting GLP 1 agonist for her sugars.  Family history of elevated sugar.  Some fatigue.  No chest pain or sob reported.  No abdominal pain.  Jodi Stewart does report some alcohol - at night.    Past Medical History:  Diagnosis Date   Complication of anesthesia    long term anesthesia effects...months later   Fibrocystic breast    pt is BRACA neg    Graves disease    had iodine treatment 1990's, now hypothyroid-   2016-2018    Hypothyroidism    Laceration of head    x2 post falls   Lichen planus    Past Surgical History:  Procedure Laterality Date   ABDOMINAL HYSTERECTOMY  2004   BREAST BIOPSY Left 2002   needle bx   COLONOSCOPY WITH PROPOFOL N/A 12/28/2015   Procedure: COLONOSCOPY WITH PROPOFOL;  Surgeon: Wallace CullensPaul Y Oh, MD;  Location: [facility] ENDOSCOPY;  Service: Gastroenterology;  Laterality: N/A;   LAPAROSCOPIC BILATERAL SALPINGO OOPHERECTOMY Bilateral 12/08/2017   Procedure: LAPAROSCOPIC BILATERAL SALPINGO OOPHORECTOMY;  Surgeon: Ward, Elenora Fenderhelsea C, MD;  Location: [facility] ORS;  Service: Gynecology;  Laterality: Bilateral;   LAPAROSCOPIC LYSIS OF ADHESIONS  12/08/2017   Procedure: LAPAROSCOPIC  LYSIS OF ADHESIONS;  Surgeon: Ward, Elenora Fenderhelsea C, MD;  Location: [facility] ORS;  Service: Gynecology;;   LYSIS OF ADHESION  12/08/2017   Procedure: URETERAL LYSIS;  Surgeon: Ward, Elenora Fenderhelsea C, MD;  Location: [facility] ORS;  Service: Gynecology;;   OPEN REDUCTION INTERNAL FIXATION (ORIF) DISTAL RADIAL FRACTURE Left 07/22/2017   Procedure: LEFT OPEN REDUCTION INTERNAL FIXATION (ORIF) DISTAL RADIAL FRACTURE AND REPAIR AS INDICATED;  Surgeon: Bradly Bienenstockrtmann, Fred, MD;  Location: [facility] OR;  Service: Orthopedics;  Laterality: Left;   VAGINAL DELIVERY     had birth center delivery   Family History  Problem Relation Age of Onset   Goiter Mother    Mental illness Mother    Ovarian cancer Mother        died 263   Breast cancer Maternal Aunt [phone]   Breast cancer Maternal Grandmother 5180   Goiter Sister    Breast cancer Sister [phone]   Cancer Maternal Aunt [phone]       Renal Cancer   Colon cancer Neg Hx    Social History   Socioeconomic History   Marital status: Married    Spouse name: Not on file   Number of children: 1   Years of education: Not on file   Highest education level: Not on file  Occupational History    Employer: [employer]  Tobacco Use   Smoking status: Former  Types: Cigarettes    Quit date: 08/16/1985    Years since quitting: 37.4   Smokeless tobacco: Never   Tobacco comments:    smoked from age [age]  Vaping Use   Vaping Use: Never used  Substance and Sexual Activity   Alcohol use: Yes    Alcohol/week: 0.0 standard drinks of alcohol    Comment: daily glass of wine   Drug use: No   Sexual activity: Not on file  Other Topics Concern   Not on file  Social History Narrative   Not on file   Social Determinants of Health   Financial Resource Strain: Not on file  Food Insecurity: Not on file  Transportation Needs: Not on file  Physical Activity: Not on file  Stress: Not on file  Social Connections: Not on file     Review of Systems  Constitutional:  Positive for fatigue. Negative for appetite  change and unexpected weight change.  HENT:  Negative for congestion and sinus pressure.   Respiratory:  Negative for cough, chest tightness and shortness of breath.   Cardiovascular:  Negative for chest pain and palpitations.  Gastrointestinal:  Negative for abdominal pain, diarrhea, nausea and vomiting.  Genitourinary:  Negative for difficulty urinating and dysuria.  Musculoskeletal:  Negative for joint swelling and myalgias.  Skin:  Negative for color change and rash.  Neurological:  Negative for dizziness and headaches.  Psychiatric/Behavioral:  Negative for agitation and dysphoric mood.        Objective:     BP 130/80   Pulse 70   Temp 98.2 F (36.8 C)   Resp 16   Ht 5\' 9"  (1.753 m)   Wt 164 lb 12.8 oz (74.8 kg)   LMP 09/18/2003   SpO2 99%   BMI 24.34 kg/m  Wt Readings from Last 3 Encounters:  12/29/22 164 lb 12.8 oz (74.8 kg)  10/20/22 167 lb 9.6 oz (76 kg)  05/27/22 172 lb 12.8 oz (78.4 kg)    Physical Exam Vitals reviewed.  Constitutional:      General: Jodi Stewart is not in acute distress.    Appearance: Normal appearance.  HENT:     Head: Normocephalic and atraumatic.     Right Ear: External ear normal.     Left Ear: External ear normal.  Eyes:     General: No scleral icterus.       Right eye: No discharge.        Left eye: No discharge.     Conjunctiva/sclera: Conjunctivae normal.  Neck:     Thyroid: No thyromegaly.  Cardiovascular:     Rate and Rhythm: Normal rate and regular rhythm.  Pulmonary:     Effort: No respiratory distress.     Breath sounds: Normal breath sounds. No wheezing.  Abdominal:     General: Bowel sounds are normal.     Palpations: Abdomen is soft.     Tenderness: There is no abdominal tenderness.  Musculoskeletal:        General: No swelling or tenderness.     Cervical back: Neck supple. No tenderness.  Lymphadenopathy:     Cervical: No cervical adenopathy.  Skin:    Findings: No erythema or rash.  Neurological:     Mental  Status: Jodi Stewart is alert.  Psychiatric:        Mood and Affect: Mood normal.        Behavior: Behavior normal.      No outpatient encounter medications on file as of 12/29/2022.  No facility-administered encounter medications on file as of 12/29/2022.     Lab Results  Component Value Date   WBC 5.3 12/29/2022   HGB 13.7 12/29/2022   HCT 41.2 12/29/2022   PLT 236.0 12/29/2022   GLUCOSE 98 12/29/2022   CHOL 163 12/29/2022   TRIG 25.0 12/29/2022   HDL 110.90 12/29/2022   LDLCALC 48 12/29/2022   ALT 20 12/29/2022   AST 24 12/29/2022   NA 141 12/29/2022   K 4.3 12/29/2022   CL 102 12/29/2022   CREATININE 0.63 12/29/2022   BUN 14 12/29/2022   CO2 30 12/29/2022   TSH 7.21 (H) 12/29/2022   HGBA1C 5.8 12/29/2022    MM 3D SCREEN BREAST BILATERAL  Result Date: 08/29/2022 CLINICAL DATA:  Screening. EXAM: DIGITAL SCREENING BILATERAL MAMMOGRAM WITH TOMOSYNTHESIS AND CAD TECHNIQUE: Bilateral screening digital craniocaudal and mediolateral oblique mammograms were obtained. Bilateral screening digital breast tomosynthesis was performed. The images were evaluated with computer-aided detection. COMPARISON:  Previous exam(s). ACR Breast Density Category c: The breast tissue is heterogeneously dense, which may obscure small masses. FINDINGS: There are no findings suspicious for malignancy. IMPRESSION: No mammographic evidence of malignancy. A result letter of this screening mammogram will be mailed directly to the patient. RECOMMENDATION: Screening mammogram in one year. (Code:SM-B-01Y) BI-RADS CATEGORY  1: Negative. Electronically Signed   By: Norva PavlovElizabeth  Brown M.D.   On: 08/29/2022 09:17       Assessment & Plan:  Hyperglycemia Assessment & Plan: Low carb diet and exercise.  Follow met b and a1c.  Discussed treatment.  Discussed diet and exercise.  BMI 24.  No diagnosis of diabetes.  Discussed metformin.  Check met b and A1c.    Orders: -     Hemoglobin A1c -     Urinalysis, Routine w reflex  microscopic  Memory change Assessment & Plan: Saw neurology.  MRI ok.  Check B12 and thiamine.   Orders: -     Vitamin B12 -     Vitamin B1  Other fatigue Assessment & Plan: Reports some fatigue.  Is exercising regularly.  Check cbc, metabolic panel, tsh, B12 and thiamine - given alcohol history.    Orders: -     Lipid panel -     Basic metabolic panel -     TSH -     CBC with Differential/Platelet -     T4, free; Future  Screening cholesterol level -     Hepatic function panel  Change in bowel movement Assessment & Plan: Noticed change in bowel movement as outlined.  Last colonoscopy 2017.  Request referral to GI - question of need for colonoscopy.    Orders: -     Ambulatory referral to Gastroenterology  Family history of ovarian cancer Assessment & Plan: Has had genetic testing.  S/p Lap BSO.   Discussed referral now for more recent testing.  Orders: -     Ambulatory referral to Genetics  Graves disease Assessment & Plan: Not on thyroid medication now.  Check tsh.        Dale Durhamharlene Diago Haik, MD

## 2022-12-30 ENCOUNTER — Other Ambulatory Visit (INDEPENDENT_AMBULATORY_CARE_PROVIDER_SITE_OTHER): Payer: Medicare Other

## 2022-12-30 ENCOUNTER — Other Ambulatory Visit: Payer: Self-pay

## 2022-12-30 DIAGNOSIS — R5383 Other fatigue: Secondary | ICD-10-CM | POA: Diagnosis not present

## 2022-12-30 DIAGNOSIS — R7989 Other specified abnormal findings of blood chemistry: Secondary | ICD-10-CM

## 2022-12-30 DIAGNOSIS — R198 Other specified symptoms and signs involving the digestive system and abdomen: Secondary | ICD-10-CM | POA: Insufficient documentation

## 2022-12-30 LAB — T4, FREE: Free T4: 0.74 ng/dL (ref 0.60–1.60)

## 2022-12-30 MED ORDER — METFORMIN HCL ER 500 MG PO TB24: 500.0000 mg | ORAL_TABLET | Freq: Every day | ORAL | 1 refills | Status: DC

## 2023-01-01 LAB — VITAMIN B1: Vitamin B1 (Thiamine): 6 nmol/L — ABNORMAL LOW (ref 8–30)

## 2023-01-02 ENCOUNTER — Encounter: Payer: Self-pay | Admitting: Internal Medicine

## 2023-01-02 NOTE — Assessment & Plan Note (Signed)
Saw neurology.  MRI ok.  Check B12 and thiamine.

## 2023-01-02 NOTE — Assessment & Plan Note (Signed)
Has had genetic testing.  S/p Lap BSO.   Discussed referral now for more recent testing.

## 2023-01-02 NOTE — Assessment & Plan Note (Signed)
Noticed change in bowel movement as outlined.  Last colonoscopy 2017.  Request referral to GI - question of need for colonoscopy.

## 2023-01-02 NOTE — Assessment & Plan Note (Signed)
Reports some fatigue.  Is exercising regularly.  Check cbc, metabolic panel, tsh, B12 and thiamine - given alcohol history.

## 2023-01-02 NOTE — Assessment & Plan Note (Signed)
Low carb diet and exercise.  Follow met b and a1c.  Discussed treatment.  Discussed diet and exercise.  BMI 24.  No diagnosis of diabetes.  Discussed metformin.  Check met b and A1c.

## 2023-01-02 NOTE — Assessment & Plan Note (Addendum)
Not on thyroid medication now.  Check tsh.

## 2023-01-03 ENCOUNTER — Telehealth: Payer: Self-pay

## 2023-01-03 ENCOUNTER — Encounter: Payer: Self-pay | Admitting: Internal Medicine

## 2023-01-03 NOTE — Telephone Encounter (Signed)
-----   Message from Dale Curtis, MD sent at 01/03/2023  5:10 AM EDT ----- Notify - thiamine is low. (Vitamin B1).  I would like to start her on 100mg  q day.

## 2023-01-03 NOTE — Telephone Encounter (Signed)
Pt returned call and I read the message to her and she wanted to know what the provider wanted to start her on

## 2023-01-04 ENCOUNTER — Other Ambulatory Visit: Payer: Self-pay

## 2023-01-04 MED ORDER — THIAMINE HCL 100 MG PO TABS: 100.0000 mg | ORAL_TABLET | Freq: Every day | ORAL | 1 refills | Status: DC

## 2023-01-04 NOTE — Telephone Encounter (Signed)
Mychart sent to patient.

## 2023-01-23 ENCOUNTER — Other Ambulatory Visit (INDEPENDENT_AMBULATORY_CARE_PROVIDER_SITE_OTHER): Payer: Medicare Other

## 2023-01-23 DIAGNOSIS — R7989 Other specified abnormal findings of blood chemistry: Secondary | ICD-10-CM

## 2023-01-23 LAB — HEPATIC FUNCTION PANEL
ALT: 16 U/L (ref 0–35)
AST: 22 U/L (ref 0–37)
Albumin: 4.3 g/dL (ref 3.5–5.2)
Alkaline Phosphatase: 48 U/L (ref 39–117)
Bilirubin, Direct: 0.2 mg/dL (ref 0.0–0.3)
Total Bilirubin: 0.7 mg/dL (ref 0.2–1.2)
Total Protein: 6.7 g/dL (ref 6.0–8.3)

## 2023-01-23 LAB — TSH: TSH: 6.34 u[IU]/mL — ABNORMAL HIGH (ref 0.35–5.50)

## 2023-01-24 ENCOUNTER — Telehealth: Payer: Self-pay

## 2023-01-24 DIAGNOSIS — R7989 Other specified abnormal findings of blood chemistry: Secondary | ICD-10-CM

## 2023-01-24 NOTE — Telephone Encounter (Signed)
-----   Message from Dale McMurray, MD sent at 01/23/2023 10:17 PM EDT ----- Notify - TSH is still slightly elevated, but has improved.  Again given slight increase, will need to follow.  Recheck TSH and free T4 in 4-6 weeks.  Liver panel wnl.

## 2023-01-25 NOTE — Telephone Encounter (Signed)
Labs ordered future 

## 2023-01-25 NOTE — Addendum Note (Signed)
Addended by: Rita Ohara D on: 01/25/2023 08:47 AM   Modules accepted: Orders

## 2023-01-25 NOTE — Telephone Encounter (Signed)
Patient returned phone call, note read and lab appt scheduled.

## 2023-03-01 ENCOUNTER — Other Ambulatory Visit (INDEPENDENT_AMBULATORY_CARE_PROVIDER_SITE_OTHER): Payer: Medicare Other

## 2023-03-01 DIAGNOSIS — R7989 Other specified abnormal findings of blood chemistry: Secondary | ICD-10-CM

## 2023-03-01 LAB — TSH: TSH: 4.94 u[IU]/mL (ref 0.35–5.50)

## 2023-03-01 LAB — T4, FREE: Free T4: 0.73 ng/dL (ref 0.60–1.60)

## 2023-05-23 DIAGNOSIS — Z803 Family history of malignant neoplasm of breast: Secondary | ICD-10-CM | POA: Diagnosis not present

## 2023-05-30 ENCOUNTER — Encounter: Payer: Medicare Other | Admitting: Internal Medicine

## 2023-06-01 ENCOUNTER — Ambulatory Visit (INDEPENDENT_AMBULATORY_CARE_PROVIDER_SITE_OTHER): Payer: Medicare Other | Admitting: Nurse Practitioner

## 2023-06-01 ENCOUNTER — Encounter: Payer: Self-pay | Admitting: Nurse Practitioner

## 2023-06-01 VITALS — BP 124/80 | HR 64 | Temp 97.7°F | Ht 69.0 in | Wt 160.6 lb

## 2023-06-01 DIAGNOSIS — R21 Rash and other nonspecific skin eruption: Secondary | ICD-10-CM | POA: Diagnosis not present

## 2023-06-01 NOTE — Progress Notes (Signed)
Established Patient Office Visit  Subjective:  [name], female    DOB: [birth year]  Age: 67 y.o. MRN: [id number]  CC:  Chief Complaint  Patient presents with   Acute Visit    Rash on Breast- raised ring    HPI  Jodi Stewart presents due to rash under breast that has been resolved.  Patient states the rash was circular and had red raised border.  The rash has resolved but now she has been experiencing itching not at the spot of the rash but underneath it.    Since the rash resolved she has been experiencing itching in the areolar region.  She used over-the-counter anti-inflammatory cream this morning due to the itching.  The patient also mention getting a new puppy last week which is not vaccinated. She is unsure if she contracted something from the puppy  Denise fever, pain, discharge from the affected area    HPI   Past Medical History:  Diagnosis Date   Complication of anesthesia    long term anesthesia effects...months later   Fibrocystic breast    pt is BRACA neg    Graves disease    had iodine treatment 1990's, now hypothyroid-   2016-2018    Hypothyroidism    Laceration of head    x2 post falls   Lichen planus     Past Surgical History:  Procedure Laterality Date   ABDOMINAL HYSTERECTOMY  2004   BREAST BIOPSY Left 2002   needle bx   COLONOSCOPY WITH PROPOFOL N/A 12/28/2015   Procedure: COLONOSCOPY WITH PROPOFOL;  Surgeon: Wallace Cullens, MD;  Location: [facility] ENDOSCOPY;  Service: Gastroenterology;  Laterality: N/A;   LAPAROSCOPIC BILATERAL SALPINGO OOPHERECTOMY Bilateral 12/08/2017   Procedure: LAPAROSCOPIC BILATERAL SALPINGO OOPHORECTOMY;  Surgeon: Ward, Elenora Fender, MD;  Location: [facility] ORS;  Service: Gynecology;  Laterality: Bilateral;   LAPAROSCOPIC LYSIS OF ADHESIONS  12/08/2017   Procedure: LAPAROSCOPIC LYSIS OF ADHESIONS;  Surgeon: Ward, Elenora Fender, MD;  Location: [facility] ORS;  Service: Gynecology;;   LYSIS OF ADHESION  12/08/2017    Procedure: URETERAL LYSIS;  Surgeon: Ward, Elenora Fender, MD;  Location: [facility] ORS;  Service: Gynecology;;   OPEN REDUCTION INTERNAL FIXATION (ORIF) DISTAL RADIAL FRACTURE Left 07/22/2017   Procedure: LEFT OPEN REDUCTION INTERNAL FIXATION (ORIF) DISTAL RADIAL FRACTURE AND REPAIR AS INDICATED;  Surgeon: Bradly Bienenstock, MD;  Location: [facility] OR;  Service: Orthopedics;  Laterality: Left;   VAGINAL DELIVERY     had birth center delivery    Family History  Problem Relation Age of Onset   Goiter Mother    Mental illness Mother    Ovarian cancer Mother        died 47   Breast cancer Maternal Aunt 27   Breast cancer Maternal Grandmother 92   Goiter Sister    Breast cancer Sister 62   Cancer Maternal Aunt 66       Renal Cancer   Colon cancer Neg Hx     Social History   Socioeconomic History   Marital status: Married    Spouse name: Not on file   Number of children: 1   Years of education: Not on file   Highest education level: Not on file  Occupational History    Employer: [employer]  Tobacco Use   Smoking status: Former    Current packs/day: 0.00    Types: Cigarettes    Quit date: 08/16/1985    Years since quitting: 37.8   Smokeless tobacco: Never  Tobacco comments:    smoked from age [age]  Vaping Use   Vaping status: Never Used  Substance and Sexual Activity   Alcohol use: Yes    Alcohol/week: 0.0 standard drinks of alcohol    Comment: daily glass of wine   Drug use: No   Sexual activity: Not on file  Other Topics Concern   Not on file  Social History Narrative   Not on file   Social Determinants of Health   Financial Resource Strain: Not on file  Food Insecurity: Not on file  Transportation Needs: Not on file  Physical Activity: Not on file  Stress: Not on file  Social Connections: Not on file  Intimate Partner Violence: Not on file     Outpatient Medications Prior to Visit  Medication Sig Dispense Refill   metFORMIN (GLUCOPHAGE-XR) 500 MG 24 hr tablet Take 1 tablet  (500 mg total) by mouth daily with breakfast. 90 tablet 1   thiamine (VITAMIN B1) 100 MG tablet Take 1 tablet (100 mg total) by mouth daily. 90 tablet 1   No facility-administered medications prior to visit.    Allergies  Allergen Reactions   Tioconazole Itching   Aspirin Rash    Welts- childhood allergy   Monistat [Miconazole] Itching and Other (See Comments)    Itching/discomfort   Penicillins Rash    Has patient had a PCN reaction causing immediate rash, facial/tongue/throat swelling, SOB or lightheadedness with hypotension: Unknown Has patient had a PCN reaction causing severe rash involving mucus membranes or skin necrosis: Unknown Has patient had a PCN reaction that required hospitalization: No Has patient had a PCN reaction occurring within the last 10 years: No If all of the above answers are "NO", then may proceed with Cephalosporin use.  Childhood allergy    ROS Review of Systems Negative unless indicated in HPI.    Objective:    Physical Exam Constitutional:      Appearance: Normal appearance.  Cardiovascular:     Rate and Rhythm: Normal rate and regular rhythm.     Pulses: Normal pulses.     Heart sounds: Normal heart sounds.  Pulmonary:     Effort: Pulmonary effort is normal.     Breath sounds: Normal breath sounds.  Chest:     Comments: No visible rash to the area. Musculoskeletal:     Cervical back: Normal range of motion.  Neurological:     General: No focal deficit present.     Mental Status: She is alert. Mental status is at baseline.  Psychiatric:        Mood and Affect: Mood normal.        Behavior: Behavior normal.        Thought Content: Thought content normal.        Judgment: Judgment normal.     BP 124/80   Pulse 64   Temp 97.7 F (36.5 C)   Ht 5\' 9"  (1.753 m)   Wt 160 lb 9.6 oz (72.8 kg)   LMP 09/18/2003   SpO2 99%   BMI 23.72 kg/m  Wt Readings from Last 3 Encounters:  06/01/23 160 lb 9.6 oz (72.8 kg)  12/29/22 164 lb 12.8 oz  (74.8 kg)  10/20/22 167 lb 9.6 oz (76 kg)     Health Maintenance  Topic Date Due   Medicare Annual Wellness (AWV)  Never done   Zoster Vaccines- Shingrix (1 of 2) Never done   COVID-19 Vaccine (1 - 2023-24 season) 06/17/2023 (Originally 05/28/2023)  INFLUENZA VACCINE  12/25/2023 (Originally 04/27/2023)   Pneumonia Vaccine 23+ Years old (1 of 1 - PCV) 12/29/2023 (Originally 07/13/2022)   MAMMOGRAM  08/27/2023   DTaP/Tdap/Td (2 - Td or Tdap) 11/05/2024   Colonoscopy  12/27/2025   Cervical Cancer Screening (HPV/Pap Cotest)  05/28/2027   DEXA SCAN  Completed   Hepatitis C Screening  Completed   HIV Screening  Completed   HPV VACCINES  Aged Out    There are no preventive care reminders to display for this patient.  Lab Results  Component Value Date   TSH 4.94 03/01/2023   Lab Results  Component Value Date   WBC 9.0 06/01/2023   HGB 13.6 06/01/2023   HCT 41.6 06/01/2023   MCV 91.0 06/01/2023   PLT 247.0 06/01/2023   Lab Results  Component Value Date   NA 141 12/29/2022   K 4.3 12/29/2022   CO2 30 12/29/2022   GLUCOSE 98 12/29/2022   BUN 14 12/29/2022   CREATININE 0.63 12/29/2022   BILITOT 0.7 01/23/2023   ALKPHOS 48 01/23/2023   AST 22 01/23/2023   ALT 16 01/23/2023   PROT 6.7 01/23/2023   ALBUMIN 4.3 01/23/2023   CALCIUM 9.4 12/29/2022   ANIONGAP 6 07/30/2018   GFR 93.07 12/29/2022   Lab Results  Component Value Date   CHOL 163 12/29/2022   Lab Results  Component Value Date   HDL 110.90 12/29/2022   Lab Results  Component Value Date   LDLCALC 48 12/29/2022   Lab Results  Component Value Date   TRIG 25.0 12/29/2022   Lab Results  Component Value Date   CHOLHDL 1 12/29/2022   Lab Results  Component Value Date   HGBA1C 5.8 12/29/2022      Assessment & Plan:  Rash Assessment & Plan: No visible rash on dimpling of the skin. Will check CBCs, sed rate and CRP for inflammation markers. Advised patient to follow-up if new symptoms arise or overall  symptoms not getting better  Orders: -     Sedimentation rate -     CBC with Differential/Platelet -     C-reactive protein    Follow-up: No follow-ups on file.   Kara Dies, NP

## 2023-06-02 LAB — CBC WITH DIFFERENTIAL/PLATELET
Basophils Absolute: 0.1 10*3/uL (ref 0.0–0.1)
Basophils Relative: 1.1 % (ref 0.0–3.0)
Eosinophils Absolute: 1.5 10*3/uL — ABNORMAL HIGH (ref 0.0–0.7)
Eosinophils Relative: 16.5 % — ABNORMAL HIGH (ref 0.0–5.0)
HCT: 41.6 % (ref 36.0–46.0)
Hemoglobin: 13.6 g/dL (ref 12.0–15.0)
Lymphocytes Relative: 25.2 % (ref 12.0–46.0)
Lymphs Abs: 2.3 10*3/uL (ref 0.7–4.0)
MCHC: 32.6 g/dL (ref 30.0–36.0)
MCV: 91 fl (ref 78.0–100.0)
Monocytes Absolute: 0.5 10*3/uL (ref 0.1–1.0)
Monocytes Relative: 6.1 % (ref 3.0–12.0)
Neutro Abs: 4.6 10*3/uL (ref 1.4–7.7)
Neutrophils Relative %: 51.1 % (ref 43.0–77.0)
Platelets: 247 10*3/uL (ref 150.0–400.0)
RBC: 4.57 Mil/uL (ref 3.87–5.11)
RDW: 13.6 % (ref 11.5–15.5)
WBC: 9 10*3/uL (ref 4.0–10.5)

## 2023-06-02 LAB — SEDIMENTATION RATE: Sed Rate: 4 mm/h (ref 0–30)

## 2023-06-02 LAB — C-REACTIVE PROTEIN: CRP: <1 mg/dL (ref 0.5–20.0)

## 2023-06-03 ENCOUNTER — Other Ambulatory Visit: Payer: Self-pay | Admitting: Nurse Practitioner

## 2023-06-12 DIAGNOSIS — R21 Rash and other nonspecific skin eruption: Secondary | ICD-10-CM | POA: Insufficient documentation

## 2023-06-12 NOTE — Assessment & Plan Note (Signed)
No visible rash on dimpling of the skin. Will check CBCs, sed rate and CRP for inflammation markers. Advised patient to follow-up if new symptoms arise or overall symptoms not getting better

## 2023-06-13 ENCOUNTER — Encounter: Payer: Self-pay | Admitting: Internal Medicine

## 2023-06-13 ENCOUNTER — Other Ambulatory Visit: Payer: Self-pay

## 2023-06-13 MED ORDER — METFORMIN HCL ER 500 MG PO TB24: 500.0000 mg | ORAL_TABLET | Freq: Every day | ORAL | 1 refills | Status: DC

## 2023-06-22 ENCOUNTER — Telehealth: Payer: Self-pay | Admitting: Internal Medicine

## 2023-06-22 NOTE — Telephone Encounter (Signed)
Copied from CRM [id number]. Topic: Medicare AWV >> Jun 22, 2023 10:36 AM Payton Doughty wrote: Reason for CRM: Called LM 06/22/2023 to schedule AWV   Verlee Rossetti; Care Guide Ambulatory Clinical Support [place] [facility] Direct Dial: [phone]

## 2023-07-07 ENCOUNTER — Encounter: Payer: Medicare Other | Admitting: Internal Medicine

## 2023-07-07 DIAGNOSIS — D721 Eosinophilia, unspecified: Secondary | ICD-10-CM | POA: Insufficient documentation

## 2023-07-07 NOTE — Progress Notes (Deleted)
Subjective:    [name], female    DOB: [birth year], 67 y.o.   MRN: [id number]  Patient here for No chief complaint on file.   HPI Here for a physical exam. Saw genetic counselor - 05/05/23 - family history of breast and ovarian cancer. Testing - negative.    Past Medical History:  Diagnosis Date   Complication of anesthesia    long term anesthesia effects...months later   Fibrocystic breast    pt is BRACA neg    Graves disease    had iodine treatment 1990's, now hypothyroid-   2016-2018    Hypothyroidism    Laceration of head    x2 post falls   Lichen planus    Past Surgical History:  Procedure Laterality Date   ABDOMINAL HYSTERECTOMY  2004   BREAST BIOPSY Left 2002   needle bx   COLONOSCOPY WITH PROPOFOL N/A 12/28/2015   Procedure: COLONOSCOPY WITH PROPOFOL;  Surgeon: Wallace Cullens, MD;  Location: [facility] ENDOSCOPY;  Service: Gastroenterology;  Laterality: N/A;   LAPAROSCOPIC BILATERAL SALPINGO OOPHERECTOMY Bilateral 12/08/2017   Procedure: LAPAROSCOPIC BILATERAL SALPINGO OOPHORECTOMY;  Surgeon: Ward, Elenora Fender, MD;  Location: [facility] ORS;  Service: Gynecology;  Laterality: Bilateral;   LAPAROSCOPIC LYSIS OF ADHESIONS  12/08/2017   Procedure: LAPAROSCOPIC LYSIS OF ADHESIONS;  Surgeon: Ward, Elenora Fender, MD;  Location: [facility] ORS;  Service: Gynecology;;   LYSIS OF ADHESION  12/08/2017   Procedure: URETERAL LYSIS;  Surgeon: Ward, Elenora Fender, MD;  Location: [facility] ORS;  Service: Gynecology;;   OPEN REDUCTION INTERNAL FIXATION (ORIF) DISTAL RADIAL FRACTURE Left 07/22/2017   Procedure: LEFT OPEN REDUCTION INTERNAL FIXATION (ORIF) DISTAL RADIAL FRACTURE AND REPAIR AS INDICATED;  Surgeon: Bradly Bienenstock, MD;  Location: [facility] OR;  Service: Orthopedics;  Laterality: Left;   VAGINAL DELIVERY     had birth center delivery   Family History  Problem Relation Age of Onset   Goiter Mother    Mental illness Mother    Ovarian cancer Mother        died 76   Breast cancer Maternal  Aunt 103   Breast cancer Maternal Grandmother 47   Goiter Sister    Breast cancer Sister 49   Cancer Maternal Aunt 43       Renal Cancer   Colon cancer Neg Hx    Social History   Socioeconomic History   Marital status: Married    Spouse name: Not on file   Number of children: 1   Years of education: Not on file   Highest education level: Not on file  Occupational History    Employer: [employer]  Tobacco Use   Smoking status: Former    Current packs/day: 0.00    Types: Cigarettes    Quit date: 08/16/1985    Years since quitting: 37.9   Smokeless tobacco: Never   Tobacco comments:    smoked from age [age]  Vaping Use   Vaping status: Never Used  Substance and Sexual Activity   Alcohol use: Yes    Alcohol/week: 0.0 standard drinks of alcohol    Comment: daily glass of wine   Drug use: No   Sexual activity: Not on file  Other Topics Concern   Not on file  Social History Narrative   Not on file   Social Determinants of Health   Financial Resource Strain: Not on file  Food Insecurity: Not on file  Transportation Needs: Not on file  Physical Activity: Not on file  Stress:  Not on file  Social Connections: Not on file     Review of Systems     Objective:     LMP 09/18/2003  Wt Readings from Last 3 Encounters:  06/01/23 160 lb 9.6 oz (72.8 kg)  12/29/22 164 lb 12.8 oz (74.8 kg)  10/20/22 167 lb 9.6 oz (76 kg)    Physical Exam   Outpatient Encounter Medications as of 07/07/2023  Medication Sig   metFORMIN (GLUCOPHAGE-XR) 500 MG 24 hr tablet Take 1 tablet (500 mg total) by mouth daily with breakfast.   thiamine (VITAMIN B1) 100 MG tablet Take 1 tablet (100 mg total) by mouth daily.   No facility-administered encounter medications on file as of 07/07/2023.     Lab Results  Component Value Date   WBC 9.0 06/01/2023   HGB 13.6 06/01/2023   HCT 41.6 06/01/2023   PLT 247.0 06/01/2023   GLUCOSE 98 12/29/2022   CHOL 163 12/29/2022   TRIG 25.0 12/29/2022   HDL  110.90 12/29/2022   LDLCALC 48 12/29/2022   ALT 16 01/23/2023   AST 22 01/23/2023   NA 141 12/29/2022   K 4.3 12/29/2022   CL 102 12/29/2022   CREATININE 0.63 12/29/2022   BUN 14 12/29/2022   CO2 30 12/29/2022   TSH 4.94 03/01/2023   HGBA1C 5.8 12/29/2022    MM 3D SCREEN BREAST BILATERAL  Result Date: 08/29/2022 CLINICAL DATA:  Screening. EXAM: DIGITAL SCREENING BILATERAL MAMMOGRAM WITH TOMOSYNTHESIS AND CAD TECHNIQUE: Bilateral screening digital craniocaudal and mediolateral oblique mammograms were obtained. Bilateral screening digital breast tomosynthesis was performed. The images were evaluated with computer-aided detection. COMPARISON:  Previous exam(s). ACR Breast Density Category c: The breast tissue is heterogeneously dense, which may obscure small masses. FINDINGS: There are no findings suspicious for malignancy. IMPRESSION: No mammographic evidence of malignancy. A result letter of this screening mammogram will be mailed directly to the patient. RECOMMENDATION: Screening mammogram in one year. (Code:SM-B-01Y) BI-RADS CATEGORY  1: Negative. Electronically Signed   By: Norva Pavlov M.D.   On: 08/29/2022 09:17       Assessment & Plan:  There are no diagnoses linked to this encounter.   Dale Bowdle, MD

## 2023-07-07 NOTE — Assessment & Plan Note (Deleted)
Physical today 07/07/23.  S/p hysterectomy.  Colonoscopy 2017.  Recommended f/u in 10 years.  Mammogram 08/26/22 - Birads I.

## 2023-07-21 ENCOUNTER — Ambulatory Visit (INDEPENDENT_AMBULATORY_CARE_PROVIDER_SITE_OTHER): Payer: Medicare Other | Admitting: Internal Medicine

## 2023-07-21 VITALS — BP 128/70 | HR 61 | Temp 97.8°F | Resp 16 | Ht 69.0 in | Wt 162.4 lb

## 2023-07-21 DIAGNOSIS — E538 Deficiency of other specified B group vitamins: Secondary | ICD-10-CM | POA: Diagnosis not present

## 2023-07-21 DIAGNOSIS — R5383 Other fatigue: Secondary | ICD-10-CM | POA: Diagnosis not present

## 2023-07-21 DIAGNOSIS — E0500 Thyrotoxicosis with diffuse goiter without thyrotoxic crisis or storm: Secondary | ICD-10-CM | POA: Diagnosis not present

## 2023-07-21 DIAGNOSIS — R739 Hyperglycemia, unspecified: Secondary | ICD-10-CM

## 2023-07-21 DIAGNOSIS — Z1231 Encounter for screening mammogram for malignant neoplasm of breast: Secondary | ICD-10-CM

## 2023-07-21 DIAGNOSIS — E519 Thiamine deficiency, unspecified: Secondary | ICD-10-CM

## 2023-07-21 DIAGNOSIS — N649 Disorder of breast, unspecified: Secondary | ICD-10-CM | POA: Diagnosis not present

## 2023-07-21 DIAGNOSIS — F1029 Alcohol dependence with unspecified alcohol-induced disorder: Secondary | ICD-10-CM

## 2023-07-21 DIAGNOSIS — Z13220 Encounter for screening for lipoid disorders: Secondary | ICD-10-CM | POA: Diagnosis not present

## 2023-07-21 DIAGNOSIS — Z8041 Family history of malignant neoplasm of ovary: Secondary | ICD-10-CM

## 2023-07-21 DIAGNOSIS — Z0000 Encounter for general adult medical examination without abnormal findings: Secondary | ICD-10-CM | POA: Diagnosis not present

## 2023-07-21 LAB — CBC WITH DIFFERENTIAL/PLATELET
Basophils Absolute: 0.1 10*3/uL (ref 0.0–0.1)
Basophils Relative: 1.5 % (ref 0.0–3.0)
Eosinophils Absolute: 0.3 10*3/uL (ref 0.0–0.7)
Eosinophils Relative: 4.9 % (ref 0.0–5.0)
HCT: 42.7 % (ref 36.0–46.0)
Hemoglobin: 13.8 g/dL (ref 12.0–15.0)
Lymphocytes Relative: 30.1 % (ref 12.0–46.0)
Lymphs Abs: 1.9 10*3/uL (ref 0.7–4.0)
MCHC: 32.3 g/dL (ref 30.0–36.0)
MCV: 91.7 fL (ref 78.0–100.0)
Monocytes Absolute: 0.5 10*3/uL (ref 0.1–1.0)
Monocytes Relative: 7.8 % (ref 3.0–12.0)
Neutro Abs: 3.6 10*3/uL (ref 1.4–7.7)
Neutrophils Relative %: 55.7 % (ref 43.0–77.0)
Platelets: 282 10*3/uL (ref 150.0–400.0)
RBC: 4.66 Mil/uL (ref 3.87–5.11)
RDW: 13.9 % (ref 11.5–15.5)
WBC: 6.4 10*3/uL (ref 4.0–10.5)

## 2023-07-21 LAB — BASIC METABOLIC PANEL
BUN: 10 mg/dL (ref 6–23)
CO2: 30 meq/L (ref 19–32)
Calcium: 9.5 mg/dL (ref 8.4–10.5)
Chloride: 102 meq/L (ref 96–112)
Creatinine, Ser: 0.64 mg/dL (ref 0.40–1.20)
GFR: 92.35 mL/min (ref >60.00)
Glucose, Bld: 94 mg/dL (ref 70–99)
Potassium: 4.4 meq/L (ref 3.5–5.1)
Sodium: 142 meq/L (ref 135–145)

## 2023-07-21 LAB — TSH: TSH: 3.08 u[IU]/mL (ref 0.35–5.50)

## 2023-07-21 LAB — HEPATIC FUNCTION PANEL
ALT: 586 U/L — ABNORMAL HIGH (ref 0–35)
AST: 373 U/L — ABNORMAL HIGH (ref 0–37)
Albumin: 4.5 g/dL (ref 3.5–5.2)
Alkaline Phosphatase: 76 U/L (ref 39–117)
Bilirubin, Direct: 0.5 mg/dL — ABNORMAL HIGH (ref 0.0–0.3)
Total Bilirubin: 2 mg/dL — ABNORMAL HIGH (ref 0.2–1.2)
Total Protein: 7.1 g/dL (ref 6.0–8.3)

## 2023-07-21 LAB — VITAMIN B12: Vitamin B-12: >1537 pg/mL — ABNORMAL HIGH (ref 211–911)

## 2023-07-21 LAB — LIPID PANEL
Cholesterol: 156 mg/dL (ref 0–200)
HDL: 86.3 mg/dL (ref >39.00)
LDL Cholesterol: 59 mg/dL (ref 0–99)
NonHDL: 69.69
Total CHOL/HDL Ratio: 2
Triglycerides: 52 mg/dL (ref 0.0–149.0)
VLDL: 10.4 mg/dL (ref 0.0–40.0)

## 2023-07-21 LAB — HEMOGLOBIN A1C: Hgb A1c MFr Bld: 5.8 % (ref 4.6–6.5)

## 2023-07-21 MED ORDER — METFORMIN HCL ER 500 MG PO TB24: 500.0000 mg | ORAL_TABLET | Freq: Every day | ORAL | 1 refills | Status: DC

## 2023-07-21 MED ORDER — MUPIROCIN 2 % EX OINT: 1.0000 | TOPICAL_OINTMENT | Freq: Two times a day (BID) | CUTANEOUS | 0 refills | Status: DC

## 2023-07-21 NOTE — Progress Notes (Unsigned)
Subjective:    [name], female    DOB: Oct 16, 1955, 67 y.o.   MRN: [id number]  Patient here for  Chief Complaint  Patient presents with   Annual Exam    HPI Here for a physical exam. Reports she is doing relatively well.  Some fatigue.  Due labs.  Taking oral B12.  No chest pain or sob reported.  No cough or congestion.  No abdominal pain reported.  Bowels doing ok.  Taking metformin.  Due labs.  Noticed while playing golf on one occasion, some shaking.  Ate a part of a sandwich and symptoms resolved.  No increased heart rate, palpitations or chest tightness with increased activity or exertion.    Past Medical History:  Diagnosis Date   Complication of anesthesia    long term anesthesia effects...months later   Fibrocystic breast    pt is BRACA neg    Graves disease    had iodine treatment 1990's, now hypothyroid-   2016-2018    Hypothyroidism    Laceration of head    x2 post falls   Lichen planus    Past Surgical History:  Procedure Laterality Date   ABDOMINAL HYSTERECTOMY  2004   BREAST BIOPSY Left 2002   needle bx   COLONOSCOPY WITH PROPOFOL N/A 12/28/2015   Procedure: COLONOSCOPY WITH PROPOFOL;  Surgeon: Wallace Cullens, MD;  Location: [facility] ENDOSCOPY;  Service: Gastroenterology;  Laterality: N/A;   LAPAROSCOPIC BILATERAL SALPINGO OOPHERECTOMY Bilateral 12/08/2017   Procedure: LAPAROSCOPIC BILATERAL SALPINGO OOPHORECTOMY;  Surgeon: Ward, Elenora Fender, MD;  Location: [facility] ORS;  Service: Gynecology;  Laterality: Bilateral;   LAPAROSCOPIC LYSIS OF ADHESIONS  12/08/2017   Procedure: LAPAROSCOPIC LYSIS OF ADHESIONS;  Surgeon: Ward, Elenora Fender, MD;  Location: [facility] ORS;  Service: Gynecology;;   LYSIS OF ADHESION  12/08/2017   Procedure: URETERAL LYSIS;  Surgeon: Ward, Elenora Fender, MD;  Location: [facility] ORS;  Service: Gynecology;;   OPEN REDUCTION INTERNAL FIXATION (ORIF) DISTAL RADIAL FRACTURE Left 07/22/2017   Procedure: LEFT OPEN REDUCTION INTERNAL FIXATION (ORIF)  DISTAL RADIAL FRACTURE AND REPAIR AS INDICATED;  Surgeon: Bradly Bienenstock, MD;  Location: [facility] OR;  Service: Orthopedics;  Laterality: Left;   VAGINAL DELIVERY     had birth center delivery   Family History  Problem Relation Age of Onset   Goiter Mother    Mental illness Mother    Ovarian cancer Mother        died 74   Breast cancer Maternal Aunt 69   Breast cancer Maternal Grandmother 52   Goiter Sister    Breast cancer Sister 32   Cancer Maternal Aunt 19       Renal Cancer   Colon cancer Neg Hx    Social History   Socioeconomic History   Marital status: Married    Spouse name: Not on file   Number of children: 1   Years of education: Not on file   Highest education level: Not on file  Occupational History    Employer: [employer]  Tobacco Use   Smoking status: Former    Current packs/day: 0.00    Types: Cigarettes    Quit date: 08/16/1985    Years since quitting: 37.9   Smokeless tobacco: Never   Tobacco comments:    smoked from age [age]  Vaping Use   Vaping status: Never Used  Substance and Sexual Activity   Alcohol use: Yes    Alcohol/week: 0.0 standard drinks of alcohol    Comment:  daily glass of wine   Drug use: No   Sexual activity: Not on file  Other Topics Concern   Not on file  Social History Narrative   Not on file   Social Determinants of Health   Financial Resource Strain: Not on file  Food Insecurity: Not on file  Transportation Needs: Not on file  Physical Activity: Not on file  Stress: Not on file  Social Connections: Not on file     Review of Systems  Constitutional:  Positive for fatigue. Negative for unexpected weight change.  HENT:  Negative for congestion, sinus pressure and sore throat.   Eyes:  Negative for pain and visual disturbance.  Respiratory:  Negative for cough, chest tightness and shortness of breath.   Cardiovascular:  Negative for chest pain, palpitations and leg swelling.  Gastrointestinal:  Negative for abdominal pain,  diarrhea, nausea and vomiting.  Genitourinary:  Negative for difficulty urinating and dysuria.  Musculoskeletal:  Negative for joint swelling and myalgias.  Skin:  Negative for color change and rash.  Neurological:  Negative for dizziness and headaches.  Hematological:  Negative for adenopathy. Does not bruise/bleed easily.  Psychiatric/Behavioral:  Negative for agitation and dysphoric mood.        Objective:     BP 128/70   Pulse 61   Temp 97.8 F (36.6 C)   Resp 16   Ht 5\' 9"  (1.753 m)   Wt 162 lb 6.4 oz (73.7 kg)   LMP 09/18/2003   SpO2 98%   BMI 23.98 kg/m  Wt Readings from Last 3 Encounters:  07/21/23 162 lb 6.4 oz (73.7 kg)  06/01/23 160 lb 9.6 oz (72.8 kg)  12/29/22 164 lb 12.8 oz (74.8 kg)    Physical Exam Vitals reviewed.  Constitutional:      General: She is not in acute distress.    Appearance: Normal appearance. She is well-developed.  HENT:     Head: Normocephalic and atraumatic.     Right Ear: External ear normal.     Left Ear: External ear normal.  Eyes:     General: No scleral icterus.       Right eye: No discharge.        Left eye: No discharge.     Conjunctiva/sclera: Conjunctivae normal.  Neck:     Thyroid: No thyromegaly.  Cardiovascular:     Rate and Rhythm: Normal rate and regular rhythm.  Pulmonary:     Effort: No tachypnea, accessory muscle usage or respiratory distress.     Breath sounds: Normal breath sounds. No decreased breath sounds or wheezing.  Chest:  Breasts:    Right: No inverted nipple, mass, nipple discharge or tenderness (no axillary adenopathy).     Left: No inverted nipple, mass, nipple discharge or tenderness (no axilarry adenopathy).  Abdominal:     General: Bowel sounds are normal.     Palpations: Abdomen is soft.     Tenderness: There is no abdominal tenderness.  Musculoskeletal:        General: No swelling or tenderness.     Cervical back: Neck supple. No tenderness.  Lymphadenopathy:     Cervical: No cervical  adenopathy.  Skin:    Findings: No erythema or rash.  Neurological:     Mental Status: She is alert and oriented to person, place, and time.  Psychiatric:        Mood and Affect: Mood normal.        Behavior: Behavior normal.  Outpatient Encounter Medications as of 07/21/2023  Medication Sig   metFORMIN (GLUCOPHAGE-XR) 500 MG 24 hr tablet Take 1 tablet (500 mg total) by mouth daily with breakfast.   thiamine (VITAMIN B1) 100 MG tablet Take 1 tablet (100 mg total) by mouth daily. (Patient not taking: Reported on 07/21/2023)   [DISCONTINUED] metFORMIN (GLUCOPHAGE-XR) 500 MG 24 hr tablet Take 1 tablet (500 mg total) by mouth daily with breakfast.   No facility-administered encounter medications on file as of 07/21/2023.     Lab Results  Component Value Date   WBC 9.0 06/01/2023   HGB 13.6 06/01/2023   HCT 41.6 06/01/2023   PLT 247.0 06/01/2023   GLUCOSE 98 12/29/2022   CHOL 163 12/29/2022   TRIG 25.0 12/29/2022   HDL 110.90 12/29/2022   LDLCALC 48 12/29/2022   ALT 16 01/23/2023   AST 22 01/23/2023   NA 141 12/29/2022   K 4.3 12/29/2022   CL 102 12/29/2022   CREATININE 0.63 12/29/2022   BUN 14 12/29/2022   CO2 30 12/29/2022   TSH 4.94 03/01/2023   HGBA1C 5.8 12/29/2022    MM 3D SCREEN BREAST BILATERAL  Result Date: 08/29/2022 CLINICAL DATA:  Screening. EXAM: DIGITAL SCREENING BILATERAL MAMMOGRAM WITH TOMOSYNTHESIS AND CAD TECHNIQUE: Bilateral screening digital craniocaudal and mediolateral oblique mammograms were obtained. Bilateral screening digital breast tomosynthesis was performed. The images were evaluated with computer-aided detection. COMPARISON:  Previous exam(s). ACR Breast Density Category c: The breast tissue is heterogeneously dense, which may obscure small masses. FINDINGS: There are no findings suspicious for malignancy. IMPRESSION: No mammographic evidence of malignancy. A result letter of this screening mammogram will be mailed directly to the patient.  RECOMMENDATION: Screening mammogram in one year. (Code:SM-B-01Y) BI-RADS CATEGORY  1: Negative. Electronically Signed   By: Norva Pavlov M.D.   On: 08/29/2022 09:17       Assessment & Plan:  Health care maintenance Assessment & Plan: Physical today 07/21/23.  S/p hysterectomy.  Colonoscopy 2017.  Recommended f/u in 10 years.  Mammogram 08/26/22 - Birads I.    Hyperglycemia -     Hemoglobin A1c -     TSH -     Basic metabolic panel  Screening cholesterol level -     Hepatic function panel -     Lipid panel  Visit for screening mammogram -     3D Screening Mammogram, Left and Right; Future  Thiamine deficiency -     Vitamin B1  Vitamin B12 deficiency -     CBC with Differential/Platelet -     Vitamin B12  Other orders -     metFORMIN HCl ER; Take 1 tablet (500 mg total) by mouth daily with breakfast.  Dispense: 90 tablet; Refill: 1     Dale Adelphi, MD

## 2023-07-21 NOTE — Assessment & Plan Note (Signed)
Physical today 07/21/23.  S/p hysterectomy.  Colonoscopy 2017.  Recommended f/u in 10 years.  Mammogram 08/26/22 - Birads I. Follow up mammogram scheduled for 09/01/23.

## 2023-07-23 ENCOUNTER — Encounter: Payer: Self-pay | Admitting: Internal Medicine

## 2023-07-23 DIAGNOSIS — N649 Disorder of breast, unspecified: Secondary | ICD-10-CM | POA: Insufficient documentation

## 2023-07-23 DIAGNOSIS — F1020 Alcohol dependence, uncomplicated: Secondary | ICD-10-CM | POA: Insufficient documentation

## 2023-07-23 NOTE — Assessment & Plan Note (Signed)
Recheck B1 today.

## 2023-07-23 NOTE — Assessment & Plan Note (Signed)
Reports some fatigue.  Is exercising regularly.  Check cbc, metabolic panel, tsh, B12 and thiamine.

## 2023-07-23 NOTE — Assessment & Plan Note (Signed)
Not on thyroid medication now.  Check tsh.

## 2023-07-23 NOTE — Assessment & Plan Note (Signed)
Check B12 level with labs.  On oral B12.

## 2023-07-23 NOTE — Assessment & Plan Note (Signed)
Low carb diet and exercise.  Follow met b and a1c.  Continues on metformin.  Discussed diet and exercise.

## 2023-07-23 NOTE — Assessment & Plan Note (Signed)
Bactroban.  Call with update.

## 2023-07-23 NOTE — Assessment & Plan Note (Signed)
Genetic testing ok.

## 2023-07-23 NOTE — Assessment & Plan Note (Signed)
Discussed.  Has decreased her alcohol intake by 1/3.  Continue to decrease.  Continue to follow.

## 2023-07-24 ENCOUNTER — Other Ambulatory Visit: Payer: Self-pay | Admitting: *Deleted

## 2023-07-24 ENCOUNTER — Telehealth: Payer: Self-pay

## 2023-07-24 ENCOUNTER — Encounter: Payer: Self-pay | Admitting: Internal Medicine

## 2023-07-24 DIAGNOSIS — R7989 Other specified abnormal findings of blood chemistry: Secondary | ICD-10-CM

## 2023-07-24 NOTE — Telephone Encounter (Signed)
Ok to recheck liver panel and GGT - dx abnormal liver function tests.

## 2023-07-24 NOTE — Telephone Encounter (Signed)
sorry to bother you. the hospital informed me that you were on call. I saw Jodi Stewart for a regular physical Friday. other than some fatigue, she had no acute issues. she does drink alcohol, but has actively been cutting back. 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 AST 373 and ALT 576 with normal alk phos. platelet count is wnl. I have been unable to get in touch with her to discuss further w/up and evaluation - ultrasound testing, etc. I was going to talk with her about follow up with you. (she has previously had a colonoscopy at [facility] - Dr Bluford Kaufmann, but given he was no longer in town, I wanted to get her established with you for further liver evaluation). let me know if there is something else you need me to do. thank you for your help. I appreciate it. hope you are doing well. Cain Saupe, MD  yes increased LFT's with Gilberts likely. Her indirect bili has been high in the past with normal LFTs therefore the Increased LFT are likely something else. I would be happy to have her seen. I will add my CMA so se will see this message Monday.

## 2023-07-25 ENCOUNTER — Other Ambulatory Visit: Payer: Self-pay | Admitting: Internal Medicine

## 2023-07-25 ENCOUNTER — Telehealth: Payer: Self-pay

## 2023-07-25 DIAGNOSIS — R7989 Other specified abnormal findings of blood chemistry: Secondary | ICD-10-CM

## 2023-07-25 LAB — VITAMIN B1: Vitamin B1 (Thiamine): 13 nmol/L (ref 8–30)

## 2023-07-25 NOTE — Telephone Encounter (Signed)
Left message on voicemail to fit pt in on 11/14

## 2023-07-25 NOTE — Telephone Encounter (Signed)
Pt called back and  I read the message and GI has not called her back

## 2023-07-25 NOTE — Telephone Encounter (Signed)
Unable to leave vm. Mailbox is full. Tried to call pt to confirm when GI appt is and to ensure she stopped taking metformin

## 2023-07-25 NOTE — Progress Notes (Signed)
Order placed for GI referral.   

## 2023-07-25 NOTE — Telephone Encounter (Signed)
-----   Message from [place] sent at 07/25/2023  3:40 AM EDT ----- Per review of her my chart message, she has stopped the metformin.  Will need to confirm GI appt scheduled.

## 2023-07-26 ENCOUNTER — Encounter: Payer: Self-pay | Admitting: *Deleted

## 2023-07-26 ENCOUNTER — Other Ambulatory Visit: Payer: Self-pay

## 2023-07-26 MED ORDER — THIAMINE HCL 100 MG PO TABS: 100.0000 mg | ORAL_TABLET | Freq: Every day | ORAL | 1 refills | Status: DC

## 2023-07-26 NOTE — Telephone Encounter (Signed)
See my chart message

## 2023-07-27 ENCOUNTER — Other Ambulatory Visit (INDEPENDENT_AMBULATORY_CARE_PROVIDER_SITE_OTHER): Payer: Medicare Other

## 2023-07-27 DIAGNOSIS — R7989 Other specified abnormal findings of blood chemistry: Secondary | ICD-10-CM | POA: Diagnosis not present

## 2023-07-27 DIAGNOSIS — R945 Abnormal results of liver function studies: Secondary | ICD-10-CM | POA: Diagnosis not present

## 2023-07-27 LAB — HEPATIC FUNCTION PANEL
ALT: 273 U/L — ABNORMAL HIGH (ref 0–35)
AST: 138 U/L — ABNORMAL HIGH (ref 0–37)
Albumin: 4.3 g/dL (ref 3.5–5.2)
Alkaline Phosphatase: 63 U/L (ref 39–117)
Bilirubin, Direct: 0.3 mg/dL (ref 0.0–0.3)
Total Bilirubin: 1.3 mg/dL — ABNORMAL HIGH (ref 0.2–1.2)
Total Protein: 7.2 g/dL (ref 6.0–8.3)

## 2023-07-27 LAB — GAMMA GT: GGT: 60 U/L — ABNORMAL HIGH (ref 7–51)

## 2023-07-27 NOTE — Telephone Encounter (Signed)
Pt has been scheduled for 11/14 in [place]

## 2023-08-04 ENCOUNTER — Encounter: Payer: Self-pay | Admitting: Pharmacist

## 2023-08-04 DIAGNOSIS — R7303 Prediabetes: Secondary | ICD-10-CM

## 2023-08-04 NOTE — Progress Notes (Signed)
Pharmacy Quality Measure Review  This patient is appearing on report for being at risk of failing the measure for Statin Use in Persons with Diabetes (SUPD) medications this calendar year.   Patient excluded from the measure for the reason: Pre-diabetes.  Next PCP visit: 2025   For patient to be removed from SUPD measure, prior to 09/26/23:  Provider must bill the non-reimbursable [facility]PCS code [phone] for $0.01 with the applicable ICD-10 code attached   Suitable ICD-10 Codes for SUPD exclusion: Pre-Diabetes R73.03 Abnormal blood glucose R97.09   Chart forwarded to PCP.   Loree Fee, PharmD Clinical Pharmacist [facility] [phone]

## 2023-08-05 NOTE — Progress Notes (Signed)
I am not sure if I understand fully what I need to do.  I can place the diagnosis of pre diabetes on the problem list, but I am not sure what is meant by "billing" given that she is not being evaluated.  Just wanted to clarify.  Thanks.

## 2023-08-08 DIAGNOSIS — R7303 Prediabetes: Secondary | ICD-10-CM | POA: Insufficient documentation

## 2023-08-08 NOTE — Progress Notes (Signed)
I have added pre diabetes to the problem list. Let me know if I need to do anything more.

## 2023-08-09 ENCOUNTER — Telehealth: Payer: Self-pay

## 2023-08-09 NOTE — Telephone Encounter (Signed)
Pt lmovm requesting labs before visit, pt did not answer Lmovm requesting pt to return my call

## 2023-08-10 ENCOUNTER — Encounter: Payer: Self-pay | Admitting: Gastroenterology

## 2023-08-10 ENCOUNTER — Ambulatory Visit: Payer: Medicare Other | Admitting: Gastroenterology

## 2023-08-10 VITALS — BP 131/82 | HR 64 | Temp 98.3°F | Ht 69.0 in | Wt 160.0 lb

## 2023-08-10 DIAGNOSIS — F1091 Alcohol use, unspecified, in remission: Secondary | ICD-10-CM | POA: Diagnosis not present

## 2023-08-10 DIAGNOSIS — R748 Abnormal levels of other serum enzymes: Secondary | ICD-10-CM | POA: Diagnosis not present

## 2023-08-10 NOTE — Progress Notes (Signed)
Gastroenterology Consultation  Referring Provider:     Dale Williams, MD Primary Care Physician:  Dale Paul Smiths, MD Primary Gastroenterologist:  Dr. Servando Snare     Reason for Consultation:     Abnormal liver enzymes        HPI:   Jodi Stewart is a 67 y.o. y/o female referred for consultation & management of abnormal liver enzymes by Dr. Lorin Picket, Westley Hummer, MD. This patient comes in today after being seen by her primary care provider and was found to have abnormal liver enzymes.  The liver enzymes were repeated shortly after and had improved.  Her liver enzymes have shown:  Component     Latest Ref Rng 01/23/2023 07/21/2023 07/27/2023  Total Bilirubin     0.2 - 1.2 mg/dL 0.7  2.0 (H)  1.3 (H)   Bilirubin, Direct     0.0 - 0.3 mg/dL 0.2  0.5 (H)  0.3   Alkaline Phosphatase     39 - 117 U/L 48  76  63   AST     0 - 37 U/L 22  373 (H)  138 (H)   ALT     0 - 35 U/L 16  586 (H)  273 (H)    With her liver enzymes being normal prior to October of this year.  The patient's GGT was also elevated at 60.  The patient had not previously seen gastroenterology except for colonoscopy by Dr. Bluford Kaufmann back in 2017.  It was reported that the patient had decreased her alcohol intake by a third during her last visit with her primary care provider. She she had stopped Etoh and metformin after finding out the results of her liver enzymes.  Past Medical History:  Diagnosis Date   Complication of anesthesia    long term anesthesia effects...months later   Fibrocystic breast    pt is BRACA neg    Graves disease    had iodine treatment 1990's, now hypothyroid-   2016-2018    Hypothyroidism    Laceration of head    x2 post falls   Lichen planus     Past Surgical History:  Procedure Laterality Date   ABDOMINAL HYSTERECTOMY  2004   BREAST BIOPSY Left 2002   needle bx   COLONOSCOPY WITH PROPOFOL N/A 12/28/2015   Procedure: COLONOSCOPY WITH PROPOFOL;  Surgeon: Wallace Cullens, MD;  Location: [facility]  ENDOSCOPY;  Service: Gastroenterology;  Laterality: N/A;   LAPAROSCOPIC BILATERAL SALPINGO OOPHERECTOMY Bilateral 12/08/2017   Procedure: LAPAROSCOPIC BILATERAL SALPINGO OOPHORECTOMY;  Surgeon: Ward, Elenora Fender, MD;  Location: [facility] ORS;  Service: Gynecology;  Laterality: Bilateral;   LAPAROSCOPIC LYSIS OF ADHESIONS  12/08/2017   Procedure: LAPAROSCOPIC LYSIS OF ADHESIONS;  Surgeon: Ward, Elenora Fender, MD;  Location: [facility] ORS;  Service: Gynecology;;   LYSIS OF ADHESION  12/08/2017   Procedure: URETERAL LYSIS;  Surgeon: Ward, Elenora Fender, MD;  Location: [facility] ORS;  Service: Gynecology;;   OPEN REDUCTION INTERNAL FIXATION (ORIF) DISTAL RADIAL FRACTURE Left 07/22/2017   Procedure: LEFT OPEN REDUCTION INTERNAL FIXATION (ORIF) DISTAL RADIAL FRACTURE AND REPAIR AS INDICATED;  Surgeon: Bradly Bienenstock, MD;  Location: [facility] OR;  Service: Orthopedics;  Laterality: Left;   VAGINAL DELIVERY     had birth center delivery    Prior to Admission medications   Medication Sig Start Date End Date Taking? Authorizing Provider  metFORMIN (GLUCOPHAGE-XR) 500 MG 24 hr tablet Take 1 tablet (500 mg total) by mouth daily with breakfast. 07/21/23   Dale Hillsboro, MD  mupirocin ointment (BACTROBAN) 2 % Apply 1 Application topically 2 (two) times daily. 07/21/23   Dale Chetek, MD  thiamine (VITAMIN B1) 100 MG tablet Take 1 tablet (100 mg total) by mouth daily. 07/26/23   Dale , MD    Family History  Problem Relation Age of Onset   Goiter Mother    Mental illness Mother    Ovarian cancer Mother        died 65   Breast cancer Maternal Aunt 59   Breast cancer Maternal Grandmother 36   Goiter Sister    Breast cancer Sister 23   Cancer Maternal Aunt 84       Renal Cancer   Colon cancer Neg Hx      Social History   Tobacco Use   Smoking status: Former    Current packs/day: 0.00    Types: Cigarettes    Quit date: 08/16/1985    Years since quitting: 38.0   Smokeless tobacco: Never   Tobacco comments:     smoked from age [age]  Vaping Use   Vaping status: Never Used  Substance Use Topics   Alcohol use: Yes    Alcohol/week: 0.0 standard drinks of alcohol    Comment: daily glass of wine   Drug use: No    Allergies as of 08/10/2023 - Review Complete 07/23/2023  Allergen Reaction Noted   Tioconazole Itching 09/17/2012   Aspirin Rash 09/17/2012   Monistat [miconazole] Itching and Other (See Comments) 09/17/2012   Penicillins Rash 09/17/2012    Review of Systems:    All systems reviewed and negative except where noted in HPI.   Physical Exam:  LMP 09/18/2003  Patient's last menstrual period was 09/18/2003. General:   Alert,  Well-developed, well-nourished, pleasant and cooperative in NAD Head:  Normocephalic and atraumatic. Eyes:  Sclera clear, no icterus.   Conjunctiva pink. Ears:  Normal auditory acuity. Neck:  Supple; no masses or thyromegaly. Lungs:  Respirations even and unlabored.  Clear throughout to auscultation.   No wheezes, crackles, or rhonchi. No acute distress. Heart:  Regular rate and rhythm; no murmurs, clicks, rubs, or gallops. Abdomen:  Normal bowel sounds.  No bruits.  Soft, non-tender and non-distended without masses, hepatosplenomegaly or hernias noted.  No guarding or rebound tenderness.  Negative Carnett sign.   Rectal:  Deferred.  Pulses:  Normal pulses noted. Extremities:  No clubbing or edema.  No cyanosis. Neurologic:  Alert and oriented x3;  grossly normal neurologically. Skin:  Intact without significant lesions or rashes.  No jaundice. Lymph Nodes:  No significant cervical adenopathy. Psych:  Alert and cooperative. Normal mood and affect.  Imaging Studies: No results found.  Assessment and Plan:   Jodi Stewart is a 67 y.o. y/o female who comes in today with a significantly elevated liver enzymes that have decreased by half at a recheck a week later.  The patient has stopped all alcohol use although the levels are too high to be  alcoholic hepatitis which usually never goes over 250-300 and usually has an AST over ALT.  The patient has been told that the cause of these abnormal liver enzymes could be from a slew of things including medications and even a viral infection and they may have even been higher prior to her having the first lab drawn.  The patient will have her liver enzymes checked again today and if they continue to drop no further workup will be needed.  The patient has been told that if the are  increasing then she will need a further more detailed workup.  The patient has been explained the plan and agrees with it.    Midge Minium, MD. Clementeen Graham    Note: This dictation was prepared with Dragon dictation along with smaller phrase technology. Any transcriptional errors that result from this process are unintentional.

## 2023-08-11 LAB — HEPATIC FUNCTION PANEL
ALT: 37 [IU]/L — ABNORMAL HIGH (ref 0–32)
AST: 27 [IU]/L (ref 0–40)
Albumin: 5 g/dL — ABNORMAL HIGH (ref 3.9–4.9)
Alkaline Phosphatase: 61 [IU]/L (ref 44–121)
Bilirubin Total: 0.9 mg/dL (ref 0.0–1.2)
Bilirubin, Direct: 0.38 mg/dL (ref 0.00–0.40)
Total Protein: 7.4 g/dL (ref 6.0–8.5)

## 2023-08-15 ENCOUNTER — Encounter: Payer: Self-pay | Admitting: Gastroenterology

## 2023-09-01 ENCOUNTER — Ambulatory Visit
Admission: RE | Admit: 2023-09-01 | Discharge: 2023-09-01 | Disposition: A | Discharge status: Home / Self Care | Payer: Medicare Other | Source: Ambulatory Visit | Attending: Internal Medicine | Admitting: Internal Medicine

## 2023-09-01 DIAGNOSIS — Z1231 Encounter for screening mammogram for malignant neoplasm of breast: Secondary | ICD-10-CM | POA: Diagnosis not present

## 2024-01-16 ENCOUNTER — Ambulatory Visit (INDEPENDENT_AMBULATORY_CARE_PROVIDER_SITE_OTHER): Admitting: *Deleted

## 2024-01-16 VITALS — Ht 69.0 in | Wt 160.0 lb

## 2024-01-16 DIAGNOSIS — Z0000 Encounter for general adult medical examination without abnormal findings: Secondary | ICD-10-CM | POA: Diagnosis not present

## 2024-01-16 NOTE — Patient Instructions (Signed)
 Jodi Stewart , Thank you for taking time to come for your Medicare Wellness Visit. I appreciate your ongoing commitment to your health goals. Please review the following plan we discussed and let me know if I can assist you in the future.   Referrals/Orders/Follow-Ups/Clinician Recommendations:Consider updating your vaccines.  This is a list of the screening recommended for you and due dates:  Health Maintenance  Topic Date Due   Pneumonia Vaccine (1 of 1 - PCV) Never done   Zoster (Shingles) Vaccine (1 of 2) Never done   COVID-19 Vaccine (1 - 2024-25 season) Never done   Flu Shot  04/26/2024   Mammogram  08/31/2024   DTaP/Tdap/Td vaccine (2 - Td or Tdap) 11/05/2024   Medicare Annual Wellness Visit  01/15/2025   Colon Cancer Screening  12/27/2025   DEXA scan (bone density measurement)  Completed   Hepatitis C Screening  Completed   HPV Vaccine  Aged Out   Meningitis B Vaccine  Aged Out    Advanced directives: (Copy Requested) Please bring a copy of your health care power of attorney and living will to the office to be added to your chart at your convenience. You can mail to [facility] [address] [place], [place] [address] or email to [email or web address]  Next Medicare Annual Wellness Visit scheduled for next year: Yes 01/24/25 @ 11:30

## 2024-01-16 NOTE — Progress Notes (Signed)
 Subjective:   Tennile Styles Cashwell is a 68 y.o. who presents for a Medicare Wellness preventive visit.  Visit Complete: Virtual I connected with  Nimrit Kehres Thrall on 01/16/24 by a audio enabled telemedicine application and verified that I am speaking with the correct person using two identifiers.  Patient Location: Other:  at work  Provider Location: Home Office  I discussed the limitations of evaluation and management by telemedicine. The patient expressed understanding and agreed to proceed.  Vital Signs: Because this visit was a virtual/telehealth visit, some criteria may be missing or patient reported. Any vitals not documented were not able to be obtained and vitals that have been documented are patient reported.  VideoDeclined- This patient declined Librarian, academic. Therefore the visit was completed with audio only.  Persons Participating in Visit: Patient.  AWV Questionnaire: No: Patient Medicare AWV questionnaire was not completed prior to this visit.  Cardiac Risk Factors include: advanced age (>24men, >70 women)     Objective:    Today's Vitals   01/16/24 1542  Weight: 160 lb (72.6 kg)  Height: 5\' 9"  (1.753 m)   Body mass index is 23.63 kg/m.     01/16/2024    3:53 PM 07/29/2018    9:15 PM 07/29/2018    3:34 PM 12/08/2017    6:13 AM 11/30/2017    1:42 PM 07/20/2017   10:09 AM 04/10/2017    1:43 PM  Advanced Directives  Does Patient Have a Medical Advance Directive? Yes No No No No No No  Type of Estate agent of [place];Living will        Does patient want to make changes to medical advance directive?      Yes (MAU/Ambulatory/Procedural Areas - Information given)   Copy of Healthcare Power of Attorney in Chart? No - copy requested        Would patient like information on creating a medical advance directive?  No - Patient declined No - Patient declined No - Patient declined No - Patient declined  Yes (MAU/Ambulatory/Procedural Areas - Information given) No - Patient declined    Current Medications (verified) Outpatient Encounter Medications as of 01/16/2024  Medication Sig   metFORMIN  (GLUCOPHAGE -XR) 500 MG 24 hr tablet Take 1 tablet (500 mg total) by mouth daily with breakfast. (Patient not taking: Reported on 01/16/2024)   mupirocin  ointment (BACTROBAN ) 2 % Apply 1 Application topically 2 (two) times daily. (Patient not taking: Reported on 01/16/2024)   No facility-administered encounter medications on file as of 01/16/2024.    Allergies (verified) Tioconazole, Aspirin, Monistat [miconazole], and Penicillins   History: Past Medical History:  Diagnosis Date   Complication of anesthesia    long term anesthesia effects...months later   Fibrocystic breast    pt is BRACA neg    Graves disease    had iodine treatment 1990's, now hypothyroid-   2016-2018    Hypothyroidism    Laceration of head    x2 post falls   Lichen planus    Past Surgical History:  Procedure Laterality Date   ABDOMINAL HYSTERECTOMY  2004   BREAST BIOPSY Left 2002   needle bx   COLONOSCOPY WITH PROPOFOL  N/A 12/28/2015   Procedure: COLONOSCOPY WITH PROPOFOL ;  Surgeon: Stephens Eis, MD;  Location: [facility] ENDOSCOPY;  Service: Gastroenterology;  Laterality: N/A;   LAPAROSCOPIC BILATERAL SALPINGO OOPHERECTOMY Bilateral 12/08/2017   Procedure: LAPAROSCOPIC BILATERAL SALPINGO OOPHORECTOMY;  Surgeon: Ward, Margarie Shay, MD;  Location: [facility] ORS;  Service: Gynecology;  Laterality: Bilateral;   LAPAROSCOPIC LYSIS OF ADHESIONS  12/08/2017   Procedure: LAPAROSCOPIC LYSIS OF ADHESIONS;  Surgeon: Ward, Margarie Shay, MD;  Location: [facility] ORS;  Service: Gynecology;;   LYSIS OF ADHESION  12/08/2017   Procedure: URETERAL LYSIS;  Surgeon: Ward, Margarie Shay, MD;  Location: [facility] ORS;  Service: Gynecology;;   OPEN REDUCTION INTERNAL FIXATION (ORIF) DISTAL RADIAL FRACTURE Left 07/22/2017   Procedure: LEFT OPEN REDUCTION INTERNAL FIXATION (ORIF)  DISTAL RADIAL FRACTURE AND REPAIR AS INDICATED;  Surgeon: Arvil Birks, MD;  Location: [facility] OR;  Service: Orthopedics;  Laterality: Left;   VAGINAL DELIVERY     had birth center delivery   Family History  Problem Relation Age of Onset   Goiter Mother    Mental illness Mother    Ovarian cancer Mother        died 73   Breast cancer Maternal Aunt 32   Breast cancer Maternal Grandmother 57   Goiter Sister    Breast cancer Sister 27   Cancer Maternal Aunt 21       Renal Cancer   Colon cancer Neg Hx    Social History   Socioeconomic History   Marital status: Married    Spouse name: Not on file   Number of children: 1   Years of education: Not on file   Highest education level: Not on file  Occupational History    Employer: [employer]  Tobacco Use   Smoking status: Former    Current packs/day: 0.00    Types: Cigarettes    Quit date: 08/16/1985    Years since quitting: 38.4   Smokeless tobacco: Never   Tobacco comments:    smoked from age [age]  Vaping Use   Vaping status: Never Used  Substance and Sexual Activity   Alcohol use: Not Currently    Comment: daily glass of wine   Drug use: No   Sexual activity: Not on file  Other Topics Concern   Not on file  Social History Narrative   Not on file   Social Drivers of Health   Financial Resource Strain: Low Risk  (01/16/2024)   Overall Financial Resource Strain (CARDIA)    Difficulty of Paying Living Expenses: Not hard at all  Food Insecurity: No Food Insecurity (01/16/2024)   Hunger Vital Sign    Worried About Running Out of Food in the Last Year: Never true    Ran Out of Food in the Last Year: Never true  Transportation Needs: No Transportation Needs (01/16/2024)   PRAPARE - Administrator, Civil Service (Medical): No    Lack of Transportation (Non-Medical): No  Physical Activity: Insufficiently Active (01/16/2024)   Exercise Vital Sign    Days of Exercise per Week: 2 days    Minutes of Exercise per Session: 60  min  Stress: No Stress Concern Present (01/16/2024)   [employer] of Occupational Health - Occupational Stress Questionnaire    Feeling of Stress : Not at all  Social Connections: Moderately Integrated (01/16/2024)   Social Connection and Isolation Panel [NHANES]    Frequency of Communication with Friends and Family: More than three times a week    Frequency of Social Gatherings with Friends and Family: Once a week    Attends Religious Services: Never    Database administrator or Organizations: Yes    Attends Engineer, structural: More than 4 times per year    Marital Status: Married    Tobacco Counseling Counseling given: Not  Answered Tobacco comments: smoked from age [age]    Clinical Intake:  Pre-visit preparation completed: Yes  Pain : No/denies pain     BMI - recorded: 23.63 Nutritional Status: BMI of 19-24  Normal Nutritional Risks: None Diabetes: No  Lab Results  Component Value Date   HGBA1C 5.8 07/21/2023   HGBA1C 5.8 12/29/2022   HGBA1C 6.0 05/27/2022     How often do you need to have someone help you when you read instructions, pamphlets, or other written materials from your doctor or pharmacy?: 1 - Never  Interpreter Needed?: No  Information entered by :: R. Sakara Lehtinen LPN   Activities of Daily Living     01/16/2024    3:44 PM  In your present state of health, do you have any difficulty performing the following activities:  Hearing? 0  Vision? 0  Difficulty concentrating or making decisions? 0  Walking or climbing stairs? 0  Dressing or bathing? 0  Doing errands, shopping? 0  Preparing Food and eating ? N  Using the Toilet? N  In the past six months, have you accidently leaked urine? N  Do you have problems with loss of bowel control? N  Managing your Medications? N  Managing your Finances? N  Housekeeping or managing your Housekeeping? N    Patient Care Team: Dellar Fenton, MD as PCP - General (Internal Medicine)  Indicate any  recent Medical Services you may have received from other than [facility] providers in the past year (date may be approximate).     Assessment:   This is a routine wellness examination for Vonne.  Hearing/Vision screen Hearing Screening - Comments:: No issues Vision Screening - Comments:: glasses   Goals Addressed             This Visit's Progress    Patient Stated       Wants to continue to stay active       Depression Screen     01/16/2024    3:47 PM 06/01/2023    2:45 PM 10/20/2022    8:12 AM 05/27/2022    8:30 AM 10/19/2017    8:33 AM 04/10/2017    1:44 PM 10/18/2016    8:36 AM  PHQ 2/9 Scores  PHQ - 2 Score 0 0 0 0 0 0 0  PHQ- 9 Score 0 0         Fall Risk     01/16/2024    3:45 PM 06/01/2023    2:45 PM 10/20/2022    8:12 AM 05/27/2022    8:30 AM 10/19/2017    8:33 AM  Fall Risk   Falls in the past year? 0 0 0 0 Yes  Number falls in past yr: 0 0 0 0 1  Injury with Fall? 0 0 0 0 Yes  Risk for fall due to : No Fall Risks No Fall Risks No Fall Risks No Fall Risks   Follow up Falls prevention discussed;Falls evaluation completed Falls evaluation completed Falls evaluation completed Falls evaluation completed     MEDICARE RISK AT HOME:  Medicare Risk at Home Any stairs in or around the home?: Yes If so, are there any without handrails?: Yes Home free of loose throw rugs in walkways, pet beds, electrical cords, etc?: Yes Adequate lighting in your home to reduce risk of falls?: Yes Life alert?: No Use of a cane, walker or w/c?: No Grab bars in the bathroom?: No Shower chair or bench in shower?: Yes Elevated toilet seat or a handicapped  toilet?: Yes  TIMED UP AND GO:  Was the test performed?  No  Cognitive Function: 6CIT completed        01/16/2024    3:53 PM  6CIT Screen  What Year? 0 points  What month? 0 points  What time? 0 points  Count back from 20 0 points  Months in reverse 0 points  Repeat phrase 0 points  Total Score 0 points     Immunizations Immunization History  Administered Date(s) Administered   Influenza-Unspecified 07/19/2017, 07/05/2018, 07/02/2019   Tdap 11/05/2014    Screening Tests Health Maintenance  Topic Date Due   Medicare Annual Wellness (AWV)  Never done   Pneumonia Vaccine 63+ Years old (1 of 1 - PCV) Never done   Zoster Vaccines- Shingrix (1 of 2) Never done   COVID-19 Vaccine (1 - 2024-25 season) Never done   INFLUENZA VACCINE  04/26/2024   MAMMOGRAM  08/31/2024   DTaP/Tdap/Td (2 - Td or Tdap) 11/05/2024   Colonoscopy  12/27/2025   DEXA SCAN  Completed   Hepatitis C Screening  Completed   HPV VACCINES  Aged Out   Meningococcal B Vaccine  Aged Out    Health Maintenance  Health Maintenance Due  Topic Date Due   Medicare Annual Wellness (AWV)  Never done   Pneumonia Vaccine 107+ Years old (1 of 1 - PCV) Never done   Zoster Vaccines- Shingrix (1 of 2) Never done   COVID-19 Vaccine (1 - 2024-25 season) Never done   Health Maintenance Items Addressed: Patient declines vaccines. Patient wants to discuss a bone density with her PCP at next visit which is this week.  Additional Screening:  Vision Screening: Recommended annual ophthalmology exams for early detection of glaucoma and other disorders of the eye.[facility]  Dental Screening: Recommended annual dental exams for proper oral hygiene  Community Resource Referral / Chronic Care Management: CRR required this visit?  No   CCM required this visit?  No     Plan:     I have personally reviewed and noted the following in the patient's chart:   Medical and social history Use of alcohol, tobacco or illicit drugs  Current medications and supplements including opioid prescriptions. Patient is not currently taking opioid prescriptions. Functional ability and status Nutritional status Physical activity Advanced directives List of other physicians Hospitalizations, surgeries, and ER visits in previous 12  months Vitals Screenings to include cognitive, depression, and falls Referrals and appointments  In addition, I have reviewed and discussed with patient certain preventive protocols, quality metrics, and best practice recommendations. A written personalized care plan for preventive services as well as general preventive health recommendations were provided to patient.     Felicitas Horse, LPN   1/61/0960   After Visit Summary: (MyChart) Due to this being a telephonic visit, the after visit summary with patients personalized plan was offered to patient via MyChart   Notes: Nothing significant to report at this time.

## 2024-01-19 ENCOUNTER — Encounter: Payer: Self-pay | Admitting: Internal Medicine

## 2024-01-19 ENCOUNTER — Ambulatory Visit (INDEPENDENT_AMBULATORY_CARE_PROVIDER_SITE_OTHER): Payer: Medicare Other | Admitting: Internal Medicine

## 2024-01-19 VITALS — BP 128/70 | HR 62 | Temp 98.0°F | Resp 16 | Ht 69.0 in | Wt 161.6 lb

## 2024-01-19 DIAGNOSIS — E0500 Thyrotoxicosis with diffuse goiter without thyrotoxic crisis or storm: Secondary | ICD-10-CM

## 2024-01-19 DIAGNOSIS — F1029 Alcohol dependence with unspecified alcohol-induced disorder: Secondary | ICD-10-CM

## 2024-01-19 DIAGNOSIS — Z13220 Encounter for screening for lipoid disorders: Secondary | ICD-10-CM

## 2024-01-19 DIAGNOSIS — R739 Hyperglycemia, unspecified: Secondary | ICD-10-CM | POA: Diagnosis not present

## 2024-01-19 DIAGNOSIS — R7989 Other specified abnormal findings of blood chemistry: Secondary | ICD-10-CM | POA: Insufficient documentation

## 2024-01-19 LAB — LIPID PANEL
Cholesterol: 147 mg/dL (ref 0–200)
HDL: 80.6 mg/dL (ref >39.00)
LDL Cholesterol: 61 mg/dL (ref 0–99)
NonHDL: 66.82
Total CHOL/HDL Ratio: 2
Triglycerides: 30 mg/dL (ref 0.0–149.0)
VLDL: 6 mg/dL (ref 0.0–40.0)

## 2024-01-19 LAB — BASIC METABOLIC PANEL WITH GFR
BUN: 16 mg/dL (ref 6–23)
CO2: 32 meq/L (ref 19–32)
Calcium: 9.4 mg/dL (ref 8.4–10.5)
Chloride: 103 meq/L (ref 96–112)
Creatinine, Ser: 0.67 mg/dL (ref 0.40–1.20)
GFR: 91.02 mL/min (ref >60.00)
Glucose, Bld: 97 mg/dL (ref 70–99)
Potassium: 4.5 meq/L (ref 3.5–5.1)
Sodium: 141 meq/L (ref 135–145)

## 2024-01-19 LAB — HEPATIC FUNCTION PANEL
ALT: 15 U/L (ref 0–35)
AST: 17 U/L (ref 0–37)
Albumin: 4.4 g/dL (ref 3.5–5.2)
Alkaline Phosphatase: 45 U/L (ref 39–117)
Bilirubin, Direct: 0.2 mg/dL (ref 0.0–0.3)
Total Bilirubin: 0.8 mg/dL (ref 0.2–1.2)
Total Protein: 6.9 g/dL (ref 6.0–8.3)

## 2024-01-19 LAB — HEMOGLOBIN A1C: Hgb A1c MFr Bld: 5.9 % (ref 4.6–6.5)

## 2024-01-19 NOTE — Progress Notes (Signed)
 Subjective:    [name], female    DOB: 03/06/1956, 68 y.o.   MRN: [id number]  Patient here for  Chief Complaint  Patient presents with   Medical Management of Chronic Issues    HPI Here for a scheduled follow up. Recently evaluated and found to have elevated liver function tests.  Saw Dr Ole Berkeley. Has significantly decreased alcohol intake. Has adjusted her diet. Last liver panel improved. Stays active. No chest pain or sob reported. No abdominal pain or bowel change reported. Handling stress. Going to the gym 1x/week. Plans to continue to stay active.    Past Medical History:  Diagnosis Date   Complication of anesthesia    long term anesthesia effects...months later   Fibrocystic breast    pt is BRACA neg    Graves disease    had iodine treatment 1990's, now hypothyroid-   2016-2018    Hypothyroidism    Laceration of head    x2 post falls   Lichen planus    Past Surgical History:  Procedure Laterality Date   ABDOMINAL HYSTERECTOMY  2004   BREAST BIOPSY Left 2002   needle bx   COLONOSCOPY WITH PROPOFOL  N/A 12/28/2015   Procedure: COLONOSCOPY WITH PROPOFOL ;  Surgeon: Stephens Eis, MD;  Location: [facility];  Service: Gastroenterology;  Laterality: N/A;   LAPAROSCOPIC BILATERAL SALPINGO OOPHERECTOMY Bilateral 12/08/2017   Procedure: LAPAROSCOPIC BILATERAL SALPINGO OOPHORECTOMY;  Surgeon: Ward, Margarie Shay, MD;  Location: [facility];  Service: Gynecology;  Laterality: Bilateral;   LAPAROSCOPIC LYSIS OF ADHESIONS  12/08/2017   Procedure: LAPAROSCOPIC LYSIS OF ADHESIONS;  Surgeon: Ward, Margarie Shay, MD;  Location: [facility];  Service: Gynecology;;   LYSIS OF ADHESION  12/08/2017   Procedure: URETERAL LYSIS;  Surgeon: Ward, Margarie Shay, MD;  Location: [facility];  Service: Gynecology;;   OPEN REDUCTION INTERNAL FIXATION (ORIF) DISTAL RADIAL FRACTURE Left 07/22/2017   Procedure: LEFT OPEN REDUCTION INTERNAL FIXATION (ORIF) DISTAL RADIAL FRACTURE AND REPAIR AS  INDICATED;  Surgeon: Arvil Birks, MD;  Location: [facility];  Service: Orthopedics;  Laterality: Left;   VAGINAL DELIVERY     had birth center delivery   Family History  Problem Relation Age of Onset   Goiter Mother    Mental illness Mother    Ovarian cancer Mother        died 60   Breast cancer Maternal Aunt 26   Breast cancer Maternal Grandmother 56   Goiter Sister    Breast cancer Sister 2   Cancer Maternal Aunt 41       Renal Cancer   Colon cancer Neg Hx    Social History   Socioeconomic History   Marital status: Married    Spouse name: Not on file   Number of children: 1   Years of education: Not on file   Highest education level: Not on file  Occupational History    Employer: [employer]  Tobacco Use   Smoking status: Former    Current packs/day: 0.00    Types: Cigarettes    Quit date: 08/16/1985    Years since quitting: 38.4   Smokeless tobacco: Never   Tobacco comments:    smoked from age [age]  Vaping Use   Vaping status: Never Used  Substance and Sexual Activity   Alcohol use: Not Currently    Comment: daily glass of wine   Drug use: No   Sexual activity: Not on file  Other Topics Concern   Not on file  Social History Narrative   Not on file   Social Drivers of Health   Financial Resource Strain: Low Risk  (01/16/2024)   Overall Financial Resource Strain (CARDIA)    Difficulty of Paying Living Expenses: Not hard at all  Food Insecurity: No Food Insecurity (01/16/2024)   Hunger Vital Sign    Worried About Running Out of Food in the Last Year: Never true    Ran Out of Food in the Last Year: Never true  Transportation Needs: No Transportation Needs (01/16/2024)   PRAPARE - Administrator, Civil Service (Medical): No    Lack of Transportation (Non-Medical): No  Physical Activity: Insufficiently Active (01/16/2024)   Exercise Vital Sign    Days of Exercise per Week: 2 days    Minutes of Exercise per Session: 60 min  Stress: No Stress Concern Present  (01/16/2024)   [employer] of Occupational Health - Occupational Stress Questionnaire    Feeling of Stress : Not at all  Social Connections: Moderately Integrated (01/16/2024)   Social Connection and Isolation Panel [NHANES]    Frequency of Communication with Friends and Family: More than three times a week    Frequency of Social Gatherings with Friends and Family: Once a week    Attends Religious Services: Never    Database administrator or Organizations: Yes    Attends Engineer, structural: More than 4 times per year    Marital Status: Married     Review of Systems  Constitutional:  Negative for appetite change and unexpected weight change.  HENT:  Negative for congestion and sinus pressure.   Respiratory:  Negative for cough, chest tightness and shortness of breath.   Cardiovascular:  Negative for chest pain, palpitations and leg swelling.  Gastrointestinal:  Negative for abdominal pain, diarrhea, nausea and vomiting.  Genitourinary:  Negative for difficulty urinating and dysuria.  Musculoskeletal:  Negative for joint swelling and myalgias.  Skin:  Negative for color change and rash.  Neurological:  Negative for dizziness and headaches.  Psychiatric/Behavioral:  Negative for agitation and dysphoric mood.        Objective:     BP 128/70   Pulse 62   Temp 98 F (36.7 C)   Resp 16   Ht 5\' 9"  (1.753 m)   Wt 161 lb 9.6 oz (73.3 kg)   LMP 09/18/2003   SpO2 99%   BMI 23.86 kg/m  Wt Readings from Last 3 Encounters:  01/19/24 161 lb 9.6 oz (73.3 kg)  01/16/24 160 lb (72.6 kg)  08/10/23 160 lb (72.6 kg)    Physical Exam Vitals reviewed.  Constitutional:      General: She is not in acute distress.    Appearance: Normal appearance.  HENT:     Head: Normocephalic and atraumatic.     Right Ear: External ear normal.     Left Ear: External ear normal.     Mouth/Throat:     Pharynx: No oropharyngeal exudate or posterior oropharyngeal erythema.  Eyes:      General: No scleral icterus.       Right eye: No discharge.        Left eye: No discharge.     Conjunctiva/sclera: Conjunctivae normal.  Neck:     Thyroid : No thyromegaly.  Cardiovascular:     Rate and Rhythm: Normal rate and regular rhythm.  Pulmonary:     Effort: No respiratory distress.     Breath sounds: Normal breath sounds. No wheezing.  Abdominal:  General: Bowel sounds are normal.     Palpations: Abdomen is soft.     Tenderness: There is no abdominal tenderness.  Musculoskeletal:        General: No swelling or tenderness.     Cervical back: Neck supple. No tenderness.  Lymphadenopathy:     Cervical: No cervical adenopathy.  Skin:    Findings: No erythema or rash.  Neurological:     Mental Status: She is alert.  Psychiatric:        Mood and Affect: Mood normal.        Behavior: Behavior normal.         Outpatient Encounter Medications as of 01/19/2024  Medication Sig   [DISCONTINUED] metFORMIN  (GLUCOPHAGE -XR) 500 MG 24 hr tablet Take 1 tablet (500 mg total) by mouth daily with breakfast. (Patient not taking: Reported on 01/16/2024)   [DISCONTINUED] mupirocin  ointment (BACTROBAN ) 2 % Apply 1 Application topically 2 (two) times daily. (Patient not taking: Reported on 01/16/2024)   No facility-administered encounter medications on file as of 01/19/2024.     Lab Results  Component Value Date   WBC 6.4 07/21/2023   HGB 13.8 07/21/2023   HCT 42.7 07/21/2023   PLT 282.0 07/21/2023   GLUCOSE 97 01/19/2024   CHOL 147 01/19/2024   TRIG 30.0 01/19/2024   HDL 80.60 01/19/2024   LDLCALC 61 01/19/2024   ALT 15 01/19/2024   AST 17 01/19/2024   NA 141 01/19/2024   K 4.5 01/19/2024   CL 103 01/19/2024   CREATININE 0.67 01/19/2024   BUN 16 01/19/2024   CO2 32 01/19/2024   TSH 3.08 07/21/2023   HGBA1C 5.9 01/19/2024    MM 3D SCREENING MAMMOGRAM BILATERAL BREAST Result Date: 09/05/2023 CLINICAL DATA:  Screening. EXAM: DIGITAL SCREENING BILATERAL MAMMOGRAM WITH  TOMOSYNTHESIS AND CAD TECHNIQUE: Bilateral screening digital craniocaudal and mediolateral oblique mammograms were obtained. Bilateral screening digital breast tomosynthesis was performed. The images were evaluated with computer-aided detection. COMPARISON:  Previous exam(s). ACR Breast Density Category c: The breasts are heterogeneously dense, which may obscure small masses. FINDINGS: There are no findings suspicious for malignancy. IMPRESSION: No mammographic evidence of malignancy. A result letter of this screening mammogram will be mailed directly to the patient. RECOMMENDATION: Screening mammogram in one year. (Code:SM-B-01Y) BI-RADS CATEGORY  1: Negative. Electronically Signed   By: Sundra Engel M.D.   On: 09/05/2023 12:24       Assessment & Plan:  Abnormal liver function tests Assessment & Plan: Evaluated by GI - Dr Ole Berkeley. Notes reviewed. Has significantly decreased alcohol intake. Last liver check improved. Recheck liver panel today.   Orders: -     Hepatic function panel  Screening cholesterol level -     Lipid panel  Hyperglycemia Assessment & Plan: Low carb diet and exercise.  Off metformin . Follow met b and A1c.   Orders: -     Hemoglobin A1c -     Basic metabolic panel with GFR  Alcohol dependence with unspecified alcohol-induced disorder (HCC) Assessment & Plan: She has decreased her alcohol intake significantly. Follow.    Graves disease Assessment & Plan: Not on thyroid  medication. Follow tsh.       Dellar Fenton, MD

## 2024-01-21 ENCOUNTER — Encounter: Payer: Self-pay | Admitting: Internal Medicine

## 2024-01-21 NOTE — Assessment & Plan Note (Signed)
 Not on thyroid medication.  Follow tsh.

## 2024-01-21 NOTE — Assessment & Plan Note (Signed)
 She has decreased her alcohol intake significantly. Follow.

## 2024-01-21 NOTE — Assessment & Plan Note (Signed)
 Evaluated by GI - Dr Ole Berkeley. Notes reviewed. Has significantly decreased alcohol intake. Last liver check improved. Recheck liver panel today.

## 2024-01-21 NOTE — Assessment & Plan Note (Signed)
 Low carb diet and exercise.  Off metformin . Follow met b and A1c.

## 2024-03-21 ENCOUNTER — Encounter: Payer: Self-pay | Admitting: Internal Medicine

## 2024-03-21 MED ORDER — TRIAMCINOLONE ACETONIDE 0.1 % EX CREA
1.0000 | TOPICAL_CREAM | Freq: Two times a day (BID) | CUTANEOUS | 0 refills | Status: AC | PRN
Start: 2024-03-21 — End: ?

## 2024-03-21 NOTE — Telephone Encounter (Signed)
 This is the patient I was talking to you about. We have not refilled the cream since 2019. Not on her med list. Patient says that she is currently using the cream on her elbow and hand right now. She had some left over from past and it is helping but she is almost out and likes to keep on hand. Pharmacy is [facility] in [place]. I have pended rx. Please confirm correct rx.

## 2024-03-21 NOTE — Telephone Encounter (Signed)
Rx sent in for triamcinolone cream.

## 2024-07-26 ENCOUNTER — Encounter: Admitting: Internal Medicine

## 2024-10-03 ENCOUNTER — Other Ambulatory Visit: Payer: Self-pay | Admitting: Internal Medicine

## 2024-10-03 DIAGNOSIS — Z1231 Encounter for screening mammogram for malignant neoplasm of breast: Secondary | ICD-10-CM

## 2024-10-25 ENCOUNTER — Encounter: Payer: Self-pay | Admitting: Internal Medicine

## 2024-10-25 ENCOUNTER — Ambulatory Visit
Admission: RE | Admit: 2024-10-25 | Discharge: 2024-10-25 | Disposition: A | Source: Ambulatory Visit | Attending: Internal Medicine | Admitting: Internal Medicine

## 2024-10-25 ENCOUNTER — Ambulatory Visit: Admitting: Internal Medicine

## 2024-10-25 VITALS — BP 138/78 | HR 60 | Temp 98.2°F | Ht 69.0 in | Wt 162.0 lb

## 2024-10-25 DIAGNOSIS — E0500 Thyrotoxicosis with diffuse goiter without thyrotoxic crisis or storm: Secondary | ICD-10-CM

## 2024-10-25 DIAGNOSIS — Z1231 Encounter for screening mammogram for malignant neoplasm of breast: Secondary | ICD-10-CM | POA: Diagnosis present

## 2024-10-25 DIAGNOSIS — Z0000 Encounter for general adult medical examination without abnormal findings: Secondary | ICD-10-CM

## 2024-10-25 DIAGNOSIS — E538 Deficiency of other specified B group vitamins: Secondary | ICD-10-CM

## 2024-10-25 DIAGNOSIS — Z136 Encounter for screening for cardiovascular disorders: Secondary | ICD-10-CM

## 2024-10-25 DIAGNOSIS — R739 Hyperglycemia, unspecified: Secondary | ICD-10-CM

## 2024-10-25 DIAGNOSIS — R002 Palpitations: Secondary | ICD-10-CM

## 2024-10-25 DIAGNOSIS — E2839 Other primary ovarian failure: Secondary | ICD-10-CM | POA: Insufficient documentation

## 2024-10-25 DIAGNOSIS — R55 Syncope and collapse: Secondary | ICD-10-CM | POA: Insufficient documentation

## 2024-10-25 DIAGNOSIS — Z9109 Other allergy status, other than to drugs and biological substances: Secondary | ICD-10-CM

## 2024-10-25 DIAGNOSIS — Z13220 Encounter for screening for lipoid disorders: Secondary | ICD-10-CM

## 2024-10-25 DIAGNOSIS — R7989 Other specified abnormal findings of blood chemistry: Secondary | ICD-10-CM

## 2024-10-25 DIAGNOSIS — F1029 Alcohol dependence with unspecified alcohol-induced disorder: Secondary | ICD-10-CM

## 2024-10-25 LAB — LIPID PANEL
Cholesterol: 173 mg/dL (ref 28–200)
HDL: 92.9 mg/dL
LDL Cholesterol: 75 mg/dL (ref 10–99)
NonHDL: 79.88
Total CHOL/HDL Ratio: 2
Triglycerides: 23 mg/dL (ref 10.0–149.0)
VLDL: 4.6 mg/dL (ref 0.0–40.0)

## 2024-10-25 LAB — CBC WITH DIFFERENTIAL/PLATELET
Basophils Absolute: 0 10*3/uL (ref 0.0–0.1)
Basophils Relative: 0.8 % (ref 0.0–3.0)
Eosinophils Absolute: 0.1 10*3/uL (ref 0.0–0.7)
Eosinophils Relative: 1.4 % (ref 0.0–5.0)
HCT: 37.6 % (ref 36.0–46.0)
Hemoglobin: 12.6 g/dL (ref 12.0–15.0)
Lymphocytes Relative: 25.7 % (ref 12.0–46.0)
Lymphs Abs: 1.4 10*3/uL (ref 0.7–4.0)
MCHC: 33.6 g/dL (ref 30.0–36.0)
MCV: 87.6 fl (ref 78.0–100.0)
Monocytes Absolute: 0.3 10*3/uL (ref 0.1–1.0)
Monocytes Relative: 6.6 % (ref 3.0–12.0)
Neutro Abs: 3.4 10*3/uL (ref 1.4–7.7)
Neutrophils Relative %: 65.5 % (ref 43.0–77.0)
Platelets: 186 10*3/uL (ref 150.0–400.0)
RBC: 4.3 Mil/uL (ref 3.87–5.11)
RDW: 13.1 % (ref 11.5–15.5)
WBC: 5.3 10*3/uL (ref 4.0–10.5)

## 2024-10-25 LAB — BASIC METABOLIC PANEL WITH GFR
BUN: 14 mg/dL (ref 6–23)
CO2: 31 meq/L (ref 19–32)
Calcium: 9.3 mg/dL (ref 8.4–10.5)
Chloride: 103 meq/L (ref 96–112)
Creatinine, Ser: 0.58 mg/dL (ref 0.40–1.20)
GFR: 93.73 mL/min
Glucose, Bld: 98 mg/dL (ref 70–99)
Potassium: 4.1 meq/L (ref 3.5–5.1)
Sodium: 139 meq/L (ref 135–145)

## 2024-10-25 LAB — HEPATIC FUNCTION PANEL
ALT: 11 U/L (ref 3–35)
AST: 15 U/L (ref 5–37)
Albumin: 4.5 g/dL (ref 3.5–5.2)
Alkaline Phosphatase: 39 U/L (ref 39–117)
Bilirubin, Direct: 0.2 mg/dL (ref 0.1–0.3)
Total Bilirubin: 0.8 mg/dL (ref 0.2–1.2)
Total Protein: 6.8 g/dL (ref 6.0–8.3)

## 2024-10-25 LAB — TSH: TSH: 1.96 u[IU]/mL (ref 0.35–5.50)

## 2024-10-25 LAB — HEMOGLOBIN A1C: Hgb A1c MFr Bld: 5.8 % (ref 4.6–6.5)

## 2024-10-25 LAB — VITAMIN B12: Vitamin B-12: 551 pg/mL (ref 211–911)

## 2024-10-25 MED ORDER — AZELASTINE HCL 0.1 % NA SOLN: 1.0000 | Freq: Two times a day (BID) | NASAL | 1 refills | Status: AC

## 2024-10-25 NOTE — Assessment & Plan Note (Signed)
 Physical today 10/25/24.  S/p hysterectomy.  Colonoscopy 2017.  Recommended f/u in 10 years.  Mammogram 09/05/23 - Birads 1. F/u mammogram scheduled for today. Bone density ordered.

## 2024-10-25 NOTE — Patient Instructions (Signed)
 Zio monitor - diagnosis - near syncope, heart pounding  Echo - near syncope, bradycardia.

## 2024-10-26 ENCOUNTER — Ambulatory Visit: Payer: Self-pay | Admitting: Internal Medicine

## 2024-10-28 ENCOUNTER — Encounter: Payer: Self-pay | Admitting: Internal Medicine

## 2024-10-28 DIAGNOSIS — Z136 Encounter for screening for cardiovascular disorders: Secondary | ICD-10-CM | POA: Insufficient documentation

## 2024-10-28 NOTE — Assessment & Plan Note (Signed)
 Request CT calcium  score.

## 2024-10-28 NOTE — Assessment & Plan Note (Signed)
 Not on thyroid  medication. Check tsh - given symptoms.

## 2024-10-28 NOTE — Assessment & Plan Note (Signed)
Check B12 level with labs today.   

## 2024-10-28 NOTE — Assessment & Plan Note (Signed)
 Evaluated by GI - Dr Ole Berkeley. Notes reviewed. Has significantly decreased alcohol intake. Last liver check improved. Recheck liver panel today.

## 2024-10-28 NOTE — Assessment & Plan Note (Signed)
 Low carb diet and exercise.  On no medication. Check met b and A1c.

## 2024-10-30 ENCOUNTER — Ambulatory Visit: Payer: Self-pay | Admitting: Internal Medicine

## 2024-10-30 ENCOUNTER — Telehealth: Payer: Self-pay

## 2024-10-30 NOTE — Telephone Encounter (Signed)
 Copied from CRM [id number]. Topic: Clinical - Lab/Test Results >> Oct 30, 2024  1:55 PM Zy'onna H wrote: Reason for CRM: Bone Density Test Results - patient missed the call and is following up to return the call of the CMA.   Patient connected to Harlene from [facility] informed that I should sent message to CMA as she is not answering her phone.   Please Advise patient of results ASAP :)

## 2024-10-31 NOTE — Telephone Encounter (Signed)
 Noted will give pt a call. Pt was calling for results

## 2024-11-26 ENCOUNTER — Other Ambulatory Visit

## 2025-01-24 ENCOUNTER — Ambulatory Visit

## 2025-01-31 ENCOUNTER — Ambulatory Visit: Admitting: Internal Medicine
